# Patient Record
Sex: Female | Born: 1970 | Race: White | Hispanic: No | Marital: Single | Smoking: Never smoker
Health system: Southern US, Community
[De-identification: ages and names within clinical notes are randomized; demographics above are authoritative.]

## PROBLEM LIST (undated history)

## (undated) DIAGNOSIS — J309 Allergic rhinitis, unspecified: Secondary | ICD-10-CM

## (undated) DIAGNOSIS — Q261 Persistent left superior vena cava: Secondary | ICD-10-CM

## (undated) DIAGNOSIS — G2581 Restless legs syndrome: Secondary | ICD-10-CM

## (undated) DIAGNOSIS — I5032 Chronic diastolic (congestive) heart failure: Secondary | ICD-10-CM

## (undated) DIAGNOSIS — Q211 Atrial septal defect, unspecified: Secondary | ICD-10-CM

## (undated) DIAGNOSIS — I82409 Acute embolism and thrombosis of unspecified deep veins of unspecified lower extremity: Secondary | ICD-10-CM

## (undated) DIAGNOSIS — Q249 Congenital malformation of heart, unspecified: Secondary | ICD-10-CM

## (undated) DIAGNOSIS — K219 Gastro-esophageal reflux disease without esophagitis: Secondary | ICD-10-CM

## (undated) DIAGNOSIS — R011 Cardiac murmur, unspecified: Secondary | ICD-10-CM

## (undated) DIAGNOSIS — L709 Acne, unspecified: Secondary | ICD-10-CM

## (undated) DIAGNOSIS — H6690 Otitis media, unspecified, unspecified ear: Secondary | ICD-10-CM

## (undated) DIAGNOSIS — F4010 Social phobia, unspecified: Secondary | ICD-10-CM

## (undated) DIAGNOSIS — G4733 Obstructive sleep apnea (adult) (pediatric): Secondary | ICD-10-CM

## (undated) DIAGNOSIS — J45909 Unspecified asthma, uncomplicated: Secondary | ICD-10-CM

## (undated) DIAGNOSIS — E669 Obesity, unspecified: Secondary | ICD-10-CM

## (undated) HISTORY — DX: Allergic rhinitis, unspecified: J30.9

## (undated) HISTORY — DX: Social phobia, unspecified: F40.10

## (undated) HISTORY — DX: Persistent left superior vena cava: Q26.1

## (undated) HISTORY — DX: Otitis media, unspecified, unspecified ear: H66.90

## (undated) HISTORY — DX: Obesity, unspecified: E66.9

## (undated) HISTORY — PX: CARDIAC SURGERY: SHX584

## (undated) HISTORY — DX: Obstructive sleep apnea (adult) (pediatric): G47.33

## (undated) HISTORY — DX: Acute embolism and thrombosis of unspecified deep veins of unspecified lower extremity: I82.409

## (undated) HISTORY — DX: Congenital malformation of heart, unspecified: Q24.9

## (undated) HISTORY — DX: Acne, unspecified: L70.9

## (undated) HISTORY — DX: Chronic diastolic (congestive) heart failure: I50.32

## (undated) HISTORY — PX: TONSILLECTOMY: SUR1361

## (undated) HISTORY — DX: Restless legs syndrome: G25.81

## (undated) HISTORY — PX: TYMPANOSTOMY TUBE PLACEMENT: SHX32

---

## 1998-11-30 ENCOUNTER — Other Ambulatory Visit: Admission: RE | Admit: 1998-11-30 | Discharge: 1998-11-30 | Payer: Self-pay

## 2002-04-29 ENCOUNTER — Other Ambulatory Visit: Admission: RE | Admit: 2002-04-29 | Discharge: 2002-04-29 | Payer: Self-pay | Admitting: Family Medicine

## 2005-06-08 ENCOUNTER — Other Ambulatory Visit: Admission: RE | Admit: 2005-06-08 | Discharge: 2005-06-08 | Payer: Self-pay | Admitting: Family Medicine

## 2007-06-28 ENCOUNTER — Other Ambulatory Visit: Admission: RE | Admit: 2007-06-28 | Discharge: 2007-06-28 | Payer: Self-pay | Admitting: Family Medicine

## 2008-11-10 ENCOUNTER — Other Ambulatory Visit: Admission: RE | Admit: 2008-11-10 | Discharge: 2008-11-10 | Payer: Self-pay | Admitting: Family Medicine

## 2012-01-23 ENCOUNTER — Other Ambulatory Visit (HOSPITAL_COMMUNITY)
Admission: RE | Admit: 2012-01-23 | Discharge: 2012-01-23 | Disposition: A | Discharge status: Home / Self Care | Payer: BC Managed Care – PPO | Source: Ambulatory Visit | Attending: Family Medicine | Admitting: Family Medicine

## 2012-01-23 ENCOUNTER — Other Ambulatory Visit: Payer: Self-pay | Admitting: Family Medicine

## 2012-01-23 DIAGNOSIS — Z1151 Encounter for screening for human papillomavirus (HPV): Secondary | ICD-10-CM | POA: Insufficient documentation

## 2012-01-23 DIAGNOSIS — Z124 Encounter for screening for malignant neoplasm of cervix: Secondary | ICD-10-CM | POA: Insufficient documentation

## 2012-11-24 ENCOUNTER — Inpatient Hospital Stay (HOSPITAL_COMMUNITY): Payer: BC Managed Care – PPO

## 2012-11-24 ENCOUNTER — Emergency Department (HOSPITAL_COMMUNITY): Payer: BC Managed Care – PPO

## 2012-11-24 ENCOUNTER — Inpatient Hospital Stay (HOSPITAL_COMMUNITY)
Admission: EM | Admit: 2012-11-24 | Discharge: 2012-11-26 | DRG: 127 | Disposition: A | Discharge status: Home / Self Care | Payer: BC Managed Care – PPO | Attending: Internal Medicine | Admitting: Internal Medicine

## 2012-11-24 ENCOUNTER — Encounter (HOSPITAL_COMMUNITY): Payer: Self-pay | Admitting: Nurse Practitioner

## 2012-11-24 DIAGNOSIS — J45909 Unspecified asthma, uncomplicated: Secondary | ICD-10-CM | POA: Diagnosis present

## 2012-11-24 DIAGNOSIS — I509 Heart failure, unspecified: Principal | ICD-10-CM

## 2012-11-24 DIAGNOSIS — J4521 Mild intermittent asthma with (acute) exacerbation: Secondary | ICD-10-CM

## 2012-11-24 DIAGNOSIS — R011 Cardiac murmur, unspecified: Secondary | ICD-10-CM

## 2012-11-24 DIAGNOSIS — Z8774 Personal history of (corrected) congenital malformations of heart and circulatory system: Secondary | ICD-10-CM

## 2012-11-24 DIAGNOSIS — Q211 Atrial septal defect, unspecified: Secondary | ICD-10-CM

## 2012-11-24 DIAGNOSIS — Z9889 Other specified postprocedural states: Secondary | ICD-10-CM

## 2012-11-24 DIAGNOSIS — I5031 Acute diastolic (congestive) heart failure: Secondary | ICD-10-CM | POA: Diagnosis present

## 2012-11-24 DIAGNOSIS — E876 Hypokalemia: Secondary | ICD-10-CM | POA: Diagnosis present

## 2012-11-24 DIAGNOSIS — J45901 Unspecified asthma with (acute) exacerbation: Secondary | ICD-10-CM

## 2012-11-24 DIAGNOSIS — F411 Generalized anxiety disorder: Secondary | ICD-10-CM | POA: Diagnosis present

## 2012-11-24 DIAGNOSIS — R0789 Other chest pain: Secondary | ICD-10-CM | POA: Diagnosis present

## 2012-11-24 DIAGNOSIS — Z8614 Personal history of Methicillin resistant Staphylococcus aureus infection: Secondary | ICD-10-CM

## 2012-11-24 DIAGNOSIS — T380X5A Adverse effect of glucocorticoids and synthetic analogues, initial encounter: Secondary | ICD-10-CM | POA: Diagnosis present

## 2012-11-24 DIAGNOSIS — I5030 Unspecified diastolic (congestive) heart failure: Secondary | ICD-10-CM

## 2012-11-24 HISTORY — DX: Cardiac murmur, unspecified: R01.1

## 2012-11-24 HISTORY — DX: Atrial septal defect, unspecified: Q21.10

## 2012-11-24 HISTORY — DX: Atrial septal defect: Q21.1

## 2012-11-24 HISTORY — DX: Unspecified asthma, uncomplicated: J45.909

## 2012-11-24 LAB — POCT I-STAT 3, ART BLOOD GAS (G3+)
Acid-base deficit: 1 mmol/L (ref 0.0–2.0)
Bicarbonate: 22.6 mEq/L (ref 20.0–24.0)
O2 Saturation: 96 %
Patient temperature: 98.5
TCO2: 24 mmol/L (ref 0–100)

## 2012-11-24 LAB — BASIC METABOLIC PANEL
GFR calc Af Amer: >90 mL/min (ref >90)
GFR calc non Af Amer: >90 mL/min (ref >90)
Glucose, Bld: 119 mg/dL — ABNORMAL HIGH (ref 70–99)
Potassium: 3.5 mEq/L (ref 3.5–5.1)
Sodium: 139 mEq/L (ref 135–145)

## 2012-11-24 LAB — CBC
Hemoglobin: 13.9 g/dL (ref 12.0–15.0)
MCHC: 33.7 g/dL (ref 30.0–36.0)
WBC: 19.7 10*3/uL — ABNORMAL HIGH (ref 4.0–10.5)

## 2012-11-24 LAB — MAGNESIUM: Magnesium: 1.7 mg/dL (ref 1.5–2.5)

## 2012-11-24 LAB — URINALYSIS, ROUTINE W REFLEX MICROSCOPIC
Bilirubin Urine: NEGATIVE
Hgb urine dipstick: NEGATIVE
Protein, ur: NEGATIVE mg/dL
Urobilinogen, UA: 0.2 mg/dL (ref 0.0–1.0)

## 2012-11-24 LAB — PREGNANCY, URINE: Preg Test, Ur: NEGATIVE

## 2012-11-24 LAB — POCT I-STAT TROPONIN I: Troponin i, poc: 0 ng/mL (ref 0.00–0.08)

## 2012-11-24 MED ORDER — ASPIRIN 81 MG PO CHEW
324.0000 mg | CHEWABLE_TABLET | Freq: Once | ORAL | Status: AC
  Administered 2012-11-24: 324 mg via ORAL
  Filled 2012-11-24: qty 4

## 2012-11-24 MED ORDER — MORPHINE SULFATE 2 MG/ML IJ SOLN
2.0000 mg | INTRAMUSCULAR | Status: DC | PRN
Start: 2012-11-24 — End: 2012-11-26
  Administered 2012-11-24: 2 mg via INTRAVENOUS
  Filled 2012-11-24: qty 1

## 2012-11-24 MED ORDER — SODIUM CHLORIDE 0.9 % IJ SOLN: 3.0000 mL | INTRAMUSCULAR | Status: DC | PRN

## 2012-11-24 MED ORDER — FLUTICASONE PROPIONATE HFA 44 MCG/ACT IN AERO
2.0000 | INHALATION_SPRAY | Freq: Two times a day (BID) | RESPIRATORY_TRACT | Status: DC
  Administered 2012-11-24 – 2012-11-26 (×4): 2 via RESPIRATORY_TRACT
  Filled 2012-11-24: qty 10.6

## 2012-11-24 MED ORDER — MUPIROCIN 2 % EX OINT
1.0000 "application " | TOPICAL_OINTMENT | Freq: Two times a day (BID) | CUTANEOUS | Status: DC
  Administered 2012-11-24 – 2012-11-26 (×4): 1 via NASAL
  Filled 2012-11-24: qty 22

## 2012-11-24 MED ORDER — IPRATROPIUM BROMIDE 0.02 % IN SOLN
0.5000 mg | Freq: Once | RESPIRATORY_TRACT | Status: AC
  Administered 2012-11-24: 0.5 mg via RESPIRATORY_TRACT
  Filled 2012-11-24: qty 2.5

## 2012-11-24 MED ORDER — HEPARIN SODIUM (PORCINE) 5000 UNIT/ML IJ SOLN
5000.0000 [IU] | Freq: Three times a day (TID) | INTRAMUSCULAR | Status: DC
  Administered 2012-11-24 – 2012-11-26 (×6): 5000 [IU] via SUBCUTANEOUS
  Filled 2012-11-24 (×8): qty 1

## 2012-11-24 MED ORDER — ONDANSETRON HCL 4 MG/2ML IJ SOLN: 4.0000 mg | Freq: Four times a day (QID) | INTRAMUSCULAR | Status: DC | PRN

## 2012-11-24 MED ORDER — SODIUM CHLORIDE 0.9 % IV SOLN: 250.0000 mL | INTRAVENOUS | Status: DC | PRN

## 2012-11-24 MED ORDER — SODIUM CHLORIDE 0.9 % IJ SOLN
3.0000 mL | Freq: Two times a day (BID) | INTRAMUSCULAR | Status: DC
  Administered 2012-11-24 – 2012-11-26 (×3): 3 mL via INTRAVENOUS

## 2012-11-24 MED ORDER — FUROSEMIDE 10 MG/ML IJ SOLN
40.0000 mg | Freq: Two times a day (BID) | INTRAMUSCULAR | Status: DC
  Administered 2012-11-24 – 2012-11-25 (×2): 40 mg via INTRAVENOUS
  Filled 2012-11-24 (×4): qty 4

## 2012-11-24 MED ORDER — MORPHINE SULFATE 2 MG/ML IJ SOLN
2.0000 mg | Freq: Once | INTRAMUSCULAR | Status: AC
  Administered 2012-11-24: 2 mg via INTRAVENOUS
  Filled 2012-11-24: qty 1

## 2012-11-24 MED ORDER — ALBUTEROL SULFATE (5 MG/ML) 0.5% IN NEBU
5.0000 mg | INHALATION_SOLUTION | Freq: Once | RESPIRATORY_TRACT | Status: AC
  Administered 2012-11-24: 5 mg via RESPIRATORY_TRACT
  Filled 2012-11-24: qty 1

## 2012-11-24 MED ORDER — ALBUTEROL SULFATE (5 MG/ML) 0.5% IN NEBU
5.0000 mg | INHALATION_SOLUTION | Freq: Four times a day (QID) | RESPIRATORY_TRACT | Status: DC
  Administered 2012-11-24: 5 mg via RESPIRATORY_TRACT
  Filled 2012-11-24 (×3): qty 0.5

## 2012-11-24 MED ORDER — ROPINIROLE HCL 1 MG PO TABS
1.0000 mg | ORAL_TABLET | Freq: Every day | ORAL | Status: DC
  Administered 2012-11-24 – 2012-11-25 (×2): 1 mg via ORAL
  Filled 2012-11-24 (×3): qty 1

## 2012-11-24 MED ORDER — IPRATROPIUM BROMIDE 0.02 % IN SOLN
0.5000 mg | Freq: Four times a day (QID) | RESPIRATORY_TRACT | Status: DC
  Administered 2012-11-24 – 2012-11-25 (×5): 0.5 mg via RESPIRATORY_TRACT
  Filled 2012-11-24 (×5): qty 2.5

## 2012-11-24 MED ORDER — CHLORHEXIDINE GLUCONATE CLOTH 2 % EX PADS
6.0000 | MEDICATED_PAD | Freq: Every day | CUTANEOUS | Status: DC
  Administered 2012-11-25 – 2012-11-26 (×2): 6 via TOPICAL

## 2012-11-24 MED ORDER — ACETAMINOPHEN 325 MG PO TABS
650.0000 mg | ORAL_TABLET | ORAL | Status: DC | PRN
  Administered 2012-11-24 – 2012-11-25 (×2): 650 mg via ORAL
  Filled 2012-11-24 (×2): qty 2

## 2012-11-24 MED ORDER — MONTELUKAST SODIUM 10 MG PO TABS
10.0000 mg | ORAL_TABLET | Freq: Every day | ORAL | Status: DC
  Administered 2012-11-24 – 2012-11-25 (×2): 10 mg via ORAL
  Filled 2012-11-24 (×3): qty 1

## 2012-11-24 MED ORDER — FUROSEMIDE 10 MG/ML IJ SOLN
20.0000 mg | Freq: Once | INTRAMUSCULAR | Status: AC
  Administered 2012-11-24: 20 mg via INTRAVENOUS
  Filled 2012-11-24 (×2): qty 2

## 2012-11-24 MED ORDER — SERTRALINE HCL 100 MG PO TABS
200.0000 mg | ORAL_TABLET | Freq: Every day | ORAL | Status: DC
  Administered 2012-11-24 – 2012-11-26 (×3): 200 mg via ORAL
  Filled 2012-11-24 (×3): qty 2

## 2012-11-24 MED ORDER — FENTANYL CITRATE 0.05 MG/ML IJ SOLN
50.0000 ug | Freq: Once | INTRAMUSCULAR | Status: AC
  Administered 2012-11-24: 50 ug via INTRAVENOUS
  Filled 2012-11-24: qty 2

## 2012-11-24 NOTE — Progress Notes (Signed)
Notified Md.  Breathing treatments given. Pt. Stated " it did help some but I'm still a little short of breath and still have chest tightness.  But its better than what is was ".   Vs stable.  No new orders at this time.  Will continue to monitor. Charlotte Chapman

## 2012-11-24 NOTE — ED Notes (Signed)
X-ray called to confirm DG chest 2 view after Portable Chest. Dr. Dierdre Searles sts wants 2 view- difficult to visualize in portable.

## 2012-11-24 NOTE — Progress Notes (Signed)
  Echocardiogram 2D Echocardiogram has been performed.  Charlotte Chapman FRANCES 11/24/2012, 5:13 PM

## 2012-11-24 NOTE — Consult Note (Signed)
Cardiology Consult Note No primary provider on file. No ref. provider found  Reason for consult: shortness of breath  History of Present Illness (and review of medical records): Charlotte Chapman is a 42 y.o. female who presents for evaluation of shortness of breath.  She is followed by Dr. Mayford Knife with [facility].  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  No other reported surgeries per patient.  She states around 9am developed acute onset of shortness of breath.  This was associated with chest tightness.  Her symptoms were not like an asthma exacerbation.  She denied any wheezing.  She denied any PND, orthopnea or LE swelling.  As her symptoms did not improve, she called EMS.  She was given ASA and morphine for chest pain.  She was admitted to the [facility] service for further evaluation and management.  We were consulted for assistance.  She has no chest pain at this time.  She does have some shortness of breath although improved.  Review of Systems She reports sinus infection a few weeks prior. No current fevers, chills, night sweats.  Further review of systems was otherwise negative other than stated in HPI.  Patient Active Problem List   Diagnosis Date Noted  . Acute CHF 11/24/2012  . Asthma   . Atrial septal defect   . Heart murmur    Past Medical History  Diagnosis Date  . Asthma   . Atrial septal defect   . Heart murmur     Past Surgical History  Procedure Laterality Date  . Cardiac surgery    . Tonsillectomy      Prescriptions prior to admission  Medication Sig Dispense Refill  . budesonide (PULMICORT) 180 MCG/ACT inhaler Inhale 1 puff into the lungs 2 (two) times daily.      Marland Kitchen desogestrel-ethinyl estradiol (ENSKYCE) 0.15-30 MG-MCG tablet Take 1 tablet by mouth daily.      . montelukast (SINGULAIR) 10 MG tablet Take 10 mg by mouth at bedtime.      Marland Kitchen rOPINIRole (REQUIP) 1 MG tablet Take 1 mg by mouth at bedtime.      . sertraline (ZOLOFT) 100 MG tablet Take 200 mg  by mouth daily.       Allergies  Allergen Reactions  . Duraflex     History  Substance Use Topics  . Smoking status: Never Smoker   . Smokeless tobacco: Not on file  . Alcohol Use: Yes    History reviewed. No pertinent family history.   Objective:  Patient Vitals for the past 8 hrs:  BP Temp Temp src Pulse Resp SpO2 Height Weight  11/24/12 2200 120/58 mmHg - - 78 25 98 % - -  11/24/12 2100 109/62 mmHg - - 76 37 98 % - -  11/24/12 2031 147/98 mmHg 98.9 F (37.2 C) Axillary 86 22 100 % - -  11/24/12 2026 - - - - - 100 % - -  11/24/12 2000 153/83 mmHg - - 86 20 97 % - -  11/24/12 1930 141/102 mmHg - - 89 22 100 % - -  11/24/12 1915 147/107 mmHg - - 82 20 100 % - -  11/24/12 1900 143/105 mmHg - - 90 23 100 % - -  11/24/12 1845 139/68 mmHg 98.4 F (36.9 C) Oral 83 24 100 % 5\' 2"  (1.575 m) 79.379 kg (175 lb)  11/24/12 1700 114/65 mmHg - - 95 23 99 % - -  11/24/12 1530 143/95 mmHg - - 84 18  100 % - -   General appearance: alert, cooperative, appears stated age and no distress Head: Normocephalic, without obvious abnormality, atraumatic Eyes: conjunctivae/corneas clear. PERRL, EOM's intact. Fundi benign. Neck: no carotid bruit, no JVD and supple, symmetrical, trachea midline Lungs: clear to auscultation bilaterally Heart: regular rate and rhythm, S1, S2 normal, Abdomen: soft, non-tender; bowel sounds normal; no masses,  no organomegaly Extremities: extremities normal, atraumatic, no cyanosis or edema Pulses: 2+ and symmetric Neurologic: Grossly normal  Results for orders placed during the hospital encounter of 11/24/12 (from the past 48 hour(s))  CBC     Status: Abnormal   Collection Time    11/24/12 11:21 AM      Result Value Range   WBC 19.7 (*) 4.0 - 10.5 K/uL   RBC 4.72  3.87 - 5.11 MIL/uL   Hemoglobin 13.9  12.0 - 15.0 g/dL   HCT 16.1  09.6 - 04.5 %   MCV 87.3  78.0 - 100.0 fL   MCH 29.4  26.0 - 34.0 pg   MCHC 33.7  30.0 - 36.0 g/dL   RDW 40.9  81.1 - 91.4 %    Platelets 309  150 - 400 K/uL  BASIC METABOLIC PANEL     Status: Abnormal   Collection Time    11/24/12 11:21 AM      Result Value Range   Sodium 139  135 - 145 mEq/L   Potassium 3.5  3.5 - 5.1 mEq/L   Chloride 105  96 - 112 mEq/L   CO2 23  19 - 32 mEq/L   Glucose, Bld 119 (*) 70 - 99 mg/dL   BUN 13  6 - 23 mg/dL   Creatinine, Ser 7.82  0.50 - 1.10 mg/dL   Calcium 9.5  8.4 - 95.6 mg/dL   GFR calc non Af Amer >90  >90 mL/min   GFR calc Af Amer >90  >90 mL/min   Comment:            The eGFR has been calculated     using the CKD EPI equation.     This calculation has not been     validated in all clinical     situations.     eGFR's persistently     <90 mL/min signify     possible Chronic Kidney Disease.  PRO B NATRIURETIC PEPTIDE     Status: Abnormal   Collection Time    11/24/12 11:21 AM      Result Value Range   Pro B Natriuretic peptide (BNP) 386.3 (*) 0 - 125 pg/mL  POCT I-STAT TROPONIN I     Status: None   Collection Time    11/24/12 11:59 AM      Result Value Range   Troponin i, poc 0.00  0.00 - 0.08 ng/mL   Comment 3            Comment: Due to the release kinetics of cTnI,     a negative result within the first hours     of the onset of symptoms does not rule out     myocardial infarction with certainty.     If myocardial infarction is still suspected,     repeat the test at appropriate intervals.  PREGNANCY, URINE     Status: None   Collection Time    11/24/12  1:57 PM      Result Value Range   Preg Test, Ur NEGATIVE  NEGATIVE   Comment:  THE SENSITIVITY OF THIS     METHODOLOGY IS >20 mIU/mL.  URINALYSIS, ROUTINE W REFLEX MICROSCOPIC     Status: None   Collection Time    11/24/12  1:57 PM      Result Value Range   Color, Urine YELLOW  YELLOW   APPearance CLEAR  CLEAR   Specific Gravity, Urine 1.028  1.005 - 1.030   pH 5.5  5.0 - 8.0   Glucose, UA NEGATIVE  NEGATIVE mg/dL   Hgb urine dipstick NEGATIVE  NEGATIVE   Bilirubin Urine NEGATIVE   NEGATIVE   Ketones, ur NEGATIVE  NEGATIVE mg/dL   Protein, ur NEGATIVE  NEGATIVE mg/dL   Urobilinogen, UA 0.2  0.0 - 1.0 mg/dL   Nitrite NEGATIVE  NEGATIVE   Leukocytes, UA NEGATIVE  NEGATIVE   Comment: MICROSCOPIC NOT DONE ON URINES WITH NEGATIVE PROTEIN, BLOOD, LEUKOCYTES, NITRITE, OR GLUCOSE <1000 mg/dL.  POCT I-STAT 3, BLOOD GAS (G3+)     Status: Abnormal   Collection Time    11/24/12  4:40 PM      Result Value Range   pH, Arterial 7.432  7.350 - 7.450   pCO2 arterial 33.8 (*) 35.0 - 45.0 mmHg   pO2, Arterial 81.0  80.0 - 100.0 mmHg   Bicarbonate 22.6  20.0 - 24.0 mEq/L   TCO2 24  0 - 100 mmol/L   O2 Saturation 96.0     Acid-base deficit 1.0  0.0 - 2.0 mmol/L   Patient temperature 98.5 F     Collection site RADIAL, ALLEN'S TEST ACCEPTABLE     Sample type ARTERIAL    TROPONIN I     Status: None   Collection Time    11/24/12  6:02 PM      Result Value Range   Troponin I <0.30  <0.30 ng/mL   Comment:            Due to the release kinetics of cTnI,     a negative result within the first hours     of the onset of symptoms does not rule out     myocardial infarction with certainty.     If myocardial infarction is still suspected,     repeat the test at appropriate intervals.  MAGNESIUM     Status: None   Collection Time    11/24/12  6:02 PM      Result Value Range   Magnesium 1.7  1.5 - 2.5 mg/dL  MRSA PCR SCREENING     Status: Abnormal   Collection Time    11/24/12  6:40 PM      Result Value Range   MRSA by PCR POSITIVE (*) NEGATIVE   Comment:            The GeneXpert MRSA Assay (FDA     approved for NASAL specimens     only), is one component of a     comprehensive MRSA colonization     surveillance program. It is not     intended to diagnose MRSA     infection nor to guide or     monitor treatment for     MRSA infections.     RESULT CALLED TO, READ BACK BY AND VERIFIED WITH:     [facility] RN 2035 11/24/12 MITCHELL,L   Dg Chest 2 View  11/24/2012   *RADIOLOGY  REPORT*  Clinical Data: Chest pain and shortness of breath.  CHEST - 2 VIEW  Comparison: 11/24/2012  Findings: Mild cardiomegaly.  Central basilar  edema.  Bibasilar atelectasis.  No pneumothorax.  No pleural effusion.  IMPRESSION: Mild CHF.  Bibasilar atelectasis.   Original Report Authenticated By: Jolaine Click, M.D.   Dg Chest Portable 1 View  11/24/2012   *RADIOLOGY REPORT*  Clinical Data: Short of breath  PORTABLE CHEST - 1 VIEW  Comparison: None  Findings: Cardiac enlargement with congestive heart failure.  There is bilateral edema and small effusions.  Right rib deformities are noted which may be congenital or due to old injury.  IMPRESSION: Congestive heart failure with pulmonary edema and small pleural effusions.   Original Report Authenticated By: Janeece Riggers, M.D.    ECG:  Sinus rhythm LVH, no prior to compare   Impression: Suspect CHF exacerbation Atypical chest pain Hx ASD closure  Recommendations: -Agree with gentle diuresis with IV lasix BID -Strict I/Os, dialy weights, 2L fluid restriction, 2gm Na diet, heart failure education -Replace elctrolyetes, keep K ~4.0, Mg ~2.0 -Monitor renal function -TTE will be read in am. -Cycle biomarkers r/o MI  Thank you for this consult.  We will follow along with you and make further recommendations as needed.

## 2012-11-24 NOTE — Progress Notes (Signed)
Md notified of pts MRSA + .  VS stable.  Educated and placed pt. On contact. Will continue to monitor. Emilie [place] Liter

## 2012-11-24 NOTE — ED Notes (Signed)
Pt reports chest pain and sob onset while watching tv this am. Describes the pain as tight and sharp pain with inspiration. 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

## 2012-11-24 NOTE — ED Provider Notes (Addendum)
CSN: [id number]     Arrival date & time 11/24/12  1107 History     First MD Initiated Contact with Patient 11/24/12 1131     Chief Complaint  Patient presents with  . Chest Pain   (Consider location/radiation/quality/duration/timing/severity/associated sxs/prior Treatment) HPI Comments: 42 year old white female presents emergency department with chest pain onset 2 hours ago. Patient denies recent travel, history of blood clots, recent surgery, history of similar pain in the past. She does have a history of asthma and she is currently taking birth control  Patient is a 42 y.o. female presenting with chest pain. The history is provided by the patient.  Chest Pain Pain location:  L chest Pain quality: aching   Pain radiates to:  Does not radiate Pain radiates to the back: no   Pain severity:  Moderate Onset quality:  Gradual Duration:  2 hours Timing:  Constant Progression:  Unchanged Chronicity:  New Context: at rest   Relieved by:  None tried Worsened by:  Deep breathing and movement Associated symptoms: no abdominal pain, no AICD problem, no altered mental status, no anorexia, no back pain, no cough, no fever, no orthopnea, no palpitations, no shortness of breath, no syncope, not vomiting and no weakness   Risk factors: birth control   Risk factors: no coronary artery disease and no diabetes mellitus     Past Medical History  Diagnosis Date  . Asthma   . Atrial septal defect   . Heart murmur    Past Surgical History  Procedure Laterality Date  . Cardiac surgery    . Tonsillectomy     History reviewed. No pertinent family history. History  Substance Use Topics  . Smoking status: Never Smoker   . Smokeless tobacco: Not on file  . Alcohol Use: Yes   OB History   Grav Para Term Preterm Abortions TAB SAB Ect Mult Living                 Review of Systems  Constitutional: Negative.  Negative for fever.  HENT: Negative.   Eyes: Negative.  Negative for discharge.   Respiratory: Negative for apnea, cough, choking, chest tightness, shortness of breath, wheezing and stridor.   Cardiovascular: Positive for chest pain. Negative for palpitations, orthopnea and syncope.  Gastrointestinal: Negative for vomiting, abdominal pain, blood in stool, abdominal distention, anal bleeding and anorexia.  Endocrine: Negative.   Genitourinary: Negative.   Musculoskeletal: Negative for back pain.  Skin: Negative.   Allergic/Immunologic: Negative.   Neurological: Negative.  Negative for weakness.  Psychiatric/Behavioral: Negative for altered mental status.    Allergies  Duraflex  Home Medications  No current outpatient prescriptions on file. BP 147/66  Pulse 64  Temp(Src) 98.5 F (36.9 C) (Oral)  Resp 20  SpO2 100% Physical Exam  Constitutional: She is oriented to person, place, and time. She appears well-developed and well-nourished.  HENT:  Head: Normocephalic and atraumatic.  Eyes: Conjunctivae are normal. Pupils are equal, round, and reactive to light.  Neck: Normal range of motion. Neck supple. No JVD present. No tracheal deviation present. No thyromegaly present.  Cardiovascular: Normal rate, regular rhythm and normal heart sounds.   No clicks rubs or murmurs appreciated There is palpation on left anterior chest wall reproduces chest pain symptoms   Pulmonary/Chest: Effort normal and breath sounds normal. No stridor.  Musculoskeletal: Normal range of motion. She exhibits no edema and no tenderness.  No clubbing cyanosis or edema  Lymphadenopathy:    She has no cervical adenopathy.  Neurological: She is alert and oriented to person, place, and time. She exhibits normal muscle tone. Coordination normal.  Skin: Skin is warm and dry.  Psychiatric: She has a normal mood and affect.    ED Course   Procedures (including critical care time)  Labs Reviewed  CBC  BASIC METABOLIC PANEL  PRO B NATRIURETIC PEPTIDE   No results found. No diagnosis  found.  EKG#1  Date: 11/24/2012  Rate: 72  Rhythm: normal sinus rhythm  QRS Axis: normal  Intervals: normal  ST/T Wave abnormalities: normal  Conduction Disutrbances:none  Narrative Interpretation:   Old EKG Reviewed: none available  EKG#2  Date: 11/24/2012  Rate: 66  Rhythm: normal sinus rhythm  QRS Axis: normal  Intervals: normal  ST/T Wave abnormalities: normal  Conduction Disutrbances:none  Narrative Interpretation: No signs of ischemic changes  Old EKG Reviewed: unchanged     Results for orders placed during the hospital encounter of 11/24/12  CBC      Result Value Range   WBC 19.7 (*) 4.0 - 10.5 K/uL   RBC 4.72  3.87 - 5.11 MIL/uL   Hemoglobin 13.9  12.0 - 15.0 g/dL   HCT 57.8  46.9 - 62.9 %   MCV 87.3  78.0 - 100.0 fL   MCH 29.4  26.0 - 34.0 pg   MCHC 33.7  30.0 - 36.0 g/dL   RDW 52.8  41.3 - 24.4 %   Platelets 309  150 - 400 K/uL  BASIC METABOLIC PANEL      Result Value Range   Sodium 139  135 - 145 mEq/L   Potassium 3.5  3.5 - 5.1 mEq/L   Chloride 105  96 - 112 mEq/L   CO2 23  19 - 32 mEq/L   Glucose, Bld 119 (*) 70 - 99 mg/dL   BUN 13  6 - 23 mg/dL   Creatinine, Ser 0.10  0.50 - 1.10 mg/dL   Calcium 9.5  8.4 - 27.2 mg/dL   GFR calc non Af Amer >90  >90 mL/min   GFR calc Af Amer >90  >90 mL/min  PRO B NATRIURETIC PEPTIDE      Result Value Range   Pro B Natriuretic peptide (BNP) 386.3 (*) 0 - 125 pg/mL  POCT I-STAT TROPONIN I      Result Value Range   Troponin i, poc 0.00  0.00 - 0.08 ng/mL   Comment 3            CXR: IMPRESSION: Congestive heart failure with pulmonary edema and small pleural effusions.    MDM  42 year old white female presents with chest pain acute onset 2 hours ago.  Pain began at rest while patient was watching TV. Vital signs on arrival normal.  EKG on arrival without signs of ischemic changes. Plan to obtain blood work to include troponin, chest x-ray, and given aspirin and morphine and continued observation or to  Darlys Gales, MD 11/24/12 1148  Darlys Gales, MD 11/24/12 1346

## 2012-11-24 NOTE — ED Notes (Signed)
Pt c/o increased CP and SOB since morphine given. Verlene Mayer RN placed pt on 2L  and made [place] RN made aware.

## 2012-11-24 NOTE — H&P (Signed)
Date: 11/24/2012               Patient Name:  Charlotte Chapman MRN: [id number]  DOB: 09/07/1970 Age / Sex: 42 y.o., female   PCP: No primary provider on file.         Medical Service: Internal Medicine Teaching Service         Attending Physician: Dr. Jonah Blue, DO    First Contact: Dr. Angelina Sheriff, MD Pager: [phone]  Second Contact: Dr. Dede Query, MD Pager: [phone]       After Hours (After 5p/  First Contact Pager: [phone]  weekends / holidays): Second Contact Pager: [phone]   Chief Complaint: Chest Pain  History of Present Illness: Charlotte Chapman Is a 42 y.o. female with a pmhx of asthma, ASD s/p repair at age 25, and anxiety who comes to the ED with an acute onset of chest pain when she woke up this morning. The patient endorses a multiple week history of upper respiratory infection symptoms. She was seen by her PCP who treated her initially with steroid and then with steroid and amoxicillin. He URI symptoms have been completely resolved for the last 1 week. This morning, the patient woke up around 9 am and had 8/10 chest pain. The pain has been continuous throughout the day. The pain is made worse with deep inspiration. Over the last week the patient denies chest pain but admits to increasing chest tightness. The patient says that this tightness is different than her baseline asthma.    In the ED, troponin was negative and there was no ST elevation. ProBNP was 305. EKG demonstrated probable LVH with secondary repol abdnormality. CXR was significant for pulmonary edema. The patients chest pain decreased after IV pain medication ans breathing treament. The IMTS was consulted to admit the patient for likely acute heart failure.  Meds: Current Facility-Administered Medications  Medication Dose Route Frequency Provider Last Rate Last Dose  . 0.9 %  sodium chloride infusion  250 mL Intravenous PRN Na Dierdre Searles, MD      . acetaminophen (TYLENOL) tablet 650 mg  650 mg Oral Q4H PRN Na Dierdre Searles, MD        . fluticasone (FLOVENT HFA) 44 MCG/ACT inhaler 2 puff  2 puff Inhalation BID Na Dierdre Searles, MD      . furosemide (LASIX) injection 40 mg  40 mg Intravenous Q12H Na Li, MD      . heparin injection 5,000 Units  5,000 Units Subcutaneous Q8H Na Li, MD      . montelukast (SINGULAIR) tablet 10 mg  10 mg Oral QHS Na Li, MD      . morphine 2 MG/ML injection 2 mg  2 mg Intravenous Q4H PRN Na Dierdre Searles, MD      . ondansetron (ZOFRAN) injection 4 mg  4 mg Intravenous Q6H PRN Na Li, MD      . rOPINIRole (REQUIP) tablet 1 mg  1 mg Oral QHS Na Li, MD      . sertraline (ZOLOFT) tablet 200 mg  200 mg Oral Daily Na Li, MD      . sodium chloride 0.9 % injection 3 mL  3 mL Intravenous Q12H Na Li, MD      . sodium chloride 0.9 % injection 3 mL  3 mL Intravenous PRN Dede Query, MD       Current Outpatient Prescriptions  Medication Sig Dispense Refill  . budesonide (PULMICORT) 180 MCG/ACT inhaler Inhale 1 puff into the lungs 2 (two) times daily.      Marland Kitchen  desogestrel-ethinyl estradiol (ENSKYCE) 0.15-30 MG-MCG tablet Take 1 tablet by mouth daily.      . montelukast (SINGULAIR) 10 MG tablet Take 10 mg by mouth at bedtime.      Marland Kitchen rOPINIRole (REQUIP) 1 MG tablet Take 1 mg by mouth at bedtime.      . sertraline (ZOLOFT) 100 MG tablet Take 200 mg by mouth daily.        Allergies: Allergies as of 11/24/2012 - Review Complete 11/24/2012  Allergen Reaction Noted  . Duraflex  11/24/2012   Past Medical History  Diagnosis Date  . Asthma   . Atrial septal defect   . Heart murmur    Past Surgical History  Procedure Laterality Date  . Cardiac surgery    . Tonsillectomy     History reviewed. No pertinent family history. History   Social History  . Marital Status: Single    Spouse Name: N/A    Number of Children: N/A  . Years of Education: N/A   Occupational History  . Not on file.   Social History Main Topics  . Smoking status: Never Smoker   . Smokeless tobacco: Not on file  . Alcohol Use: Yes  . Drug Use: No  . Sexually  Active: Not on file   Other Topics Concern  . Not on file   Social History Narrative  . No narrative on file    Review of Systems: Review of Systems  Constitutional: Positive for diaphoresis. Negative for fever, chills and malaise/fatigue.  HENT: Negative for sore throat.   Eyes: Negative for blurred vision and double vision.  Respiratory: Positive for shortness of breath. Negative for cough and wheezing.   Cardiovascular: Positive for chest pain. Negative for palpitations and leg swelling.  Gastrointestinal: Negative for nausea, vomiting, abdominal pain, diarrhea and constipation.  Genitourinary: Negative for dysuria, urgency and frequency.  Skin: Negative for itching and rash.  Neurological: Negative for weakness and headaches.     Physical Exam: Blood pressure 114/65, pulse 95, temperature 98.5 F (36.9 C), temperature source Oral, resp. rate 23, last menstrual period 11/10/2012, SpO2 99.00%. Physical Exam  Constitutional: She is oriented to person, place, and time. She appears well-developed and well-nourished. No distress.  HENT:  Head: Normocephalic and atraumatic.  Mouth/Throat: Oropharynx is clear and moist. No oropharyngeal exudate.  Eyes: EOM are normal. Pupils are equal, round, and reactive to light.  Neck: Normal range of motion. Neck supple. JVD present.  Cardiovascular: Normal rate and regular rhythm.   Systolic III/6 crescendo decrescendo heard loudest in the LUSB.  Musculoskeletal:  Tender to palpation on the left sternum. This IS NOT THE PAIN THAT THE PATIENT IS COMPLAINING OF.  Neurological: She is alert and oriented to person, place, and time.  Skin: She is not diaphoretic.  Psychiatric: She has a normal mood and affect. Her behavior is normal.  anxious    Lab results: Basic Metabolic Panel:  Recent Labs  14/78/29 1121  NA 139  K 3.5  CL 105  CO2 23  GLUCOSE 119*  BUN 13  CREATININE 0.65  CALCIUM 9.5   CBC:  Recent Labs  11/24/12 1121    WBC 19.7*  HGB 13.9  HCT 41.2  MCV 87.3  PLT 309   Cardiac Enzymes: No results found for this basename: CKTOTAL, CKMB, CKMBINDEX, TROPONINI,  in the last 72 hours BNP:  Recent Labs  11/24/12 1121  PROBNP 386.3*   D-Dimer: No results found for this basename: DDIMER,  in the last 72  hours CBG: No results found for this basename: GLUCAP,  in the last 72 hours Hemoglobin A1C: No results found for this basename: HGBA1C,  in the last 72 hours Fasting Lipid Panel: No results found for this basename: CHOL, HDL, LDLCALC, TRIG, CHOLHDL, LDLDIRECT,  in the last 72 hours Thyroid Function Tests: No results found for this basename: TSH, T4TOTAL, FREET4, T3FREE, THYROIDAB,  in the last 72 hours Anemia Panel: No results found for this basename: VITAMINB12, FOLATE, FERRITIN, TIBC, IRON, RETICCTPCT,  in the last 72 hours Coagulation: No results found for this basename: LABPROT, INR,  in the last 72 hours Urine Drug Screen: Drugs of Abuse  No results found for this basename: labopia,  cocainscrnur,  labbenz,  amphetmu,  thcu,  labbarb    Alcohol Level: No results found for this basename: ETH,  in the last 72 hours Urinalysis:  Recent Labs  11/24/12 1357  COLORURINE YELLOW  LABSPEC 1.028  PHURINE 5.5  GLUCOSEU NEGATIVE  HGBUR NEGATIVE  BILIRUBINUR NEGATIVE  KETONESUR NEGATIVE  PROTEINUR NEGATIVE  UROBILINOGEN 0.2  NITRITE NEGATIVE  LEUKOCYTESUR NEGATIVE   Imaging results:  Dg Chest 2 View  11/24/2012   *RADIOLOGY REPORT*  Clinical Data: Chest pain and shortness of breath.  CHEST - 2 VIEW  Comparison: 11/24/2012  Findings: Mild cardiomegaly.  Central basilar edema.  Bibasilar atelectasis.  No pneumothorax.  No pleural effusion.  IMPRESSION: Mild CHF.  Bibasilar atelectasis.   Original Report Authenticated By: Jolaine Click, M.D.   Dg Chest Portable 1 View  11/24/2012   *RADIOLOGY REPORT*  Clinical Data: Short of breath  PORTABLE CHEST - 1 VIEW  Comparison: None  Findings: Cardiac  enlargement with congestive heart failure.  There is bilateral edema and small effusions.  Right rib deformities are noted which may be congenital or due to old injury.  IMPRESSION: Congestive heart failure with pulmonary edema and small pleural effusions.   Original Report Authenticated By: Janeece Riggers, M.D.    Other results: EKG: sinus rhythm with borderline LVH and repol abdnormality  Assessment & Plan by Problem: Principal Problem:   Acute CHF Active Problems:   Asthma   Atrial septal defect  Assessment Plan  Acute CHF The patients acute onset chest pain and pulmonary edema is of an unknown etiology at this time (TIMI =1). The differential includes viral viral induced cardiomyopathy, myocarditis, NSTEMI, pericardial effusion without tamponade, valvular pathology, and PE. PE is unlikely as Geneva score is 0. Her history is consistent with a viral induced cardiomyopathy. Tropin 0.0 x1 - Consult [facility] for stat ECHO, whoa greed to see the patient - Lasix for diuresis (40 mg q 12 hr) - Morphine IV for chest pain - ABG - 2 view CXR - Step down unit as patient continues to have 5/10 chest pain. - F/U TSH, Mag Trop   Asthma The patients asthma may be contributing to her chest tightness. However, she denies wheezing and states that her current sensation does not feel like her normal asthma attacks. - Ipratropium and Albuterol - Montelukast - O2 titrated   Chest Wall Pain The patient has pain with palpation of her sternum. She states that this pain IS NOT the chest discomfort that brought her to the hospital. - Tylenol prn - ASA  Atrial Septal Defect - F/U Echo  Anxiety The patient states that she is anxious, but does not feel that her current chest pain is due to anxiety. - Continue home sertraline  DVT/Nutrition Regular Diet    Dispo:  Disposition is deferred at this time, awaiting improvement of current medical problems. Anticipated discharge in approximately 2-3 day(s).     The patient does have a current PCP (No primary provider on file.) and does not need an [facility] hospital follow-up appointment after discharge.  The patient does not have transportation limitations that hinder transportation to clinic appointments.  Signed: Pleas Koch, MD 11/24/2012, 6:02 PM

## 2012-11-24 NOTE — ED Notes (Signed)
Vascular technician at bedside 

## 2012-11-24 NOTE — ED Notes (Signed)
Terry with portable X-ray at bedside.

## 2012-11-25 DIAGNOSIS — I5031 Acute diastolic (congestive) heart failure: Secondary | ICD-10-CM | POA: Diagnosis present

## 2012-11-25 LAB — BASIC METABOLIC PANEL
BUN: 12 mg/dL (ref 6–23)
Calcium: 9.5 mg/dL (ref 8.4–10.5)
Creatinine, Ser: 0.71 mg/dL (ref 0.50–1.10)
GFR calc Af Amer: >90 mL/min (ref >90)
GFR calc non Af Amer: >90 mL/min (ref >90)
Glucose, Bld: 120 mg/dL — ABNORMAL HIGH (ref 70–99)
Potassium: 3.2 mEq/L — ABNORMAL LOW (ref 3.5–5.1)

## 2012-11-25 LAB — TROPONIN I: Troponin I: <0.3 ng/mL (ref <0.30)

## 2012-11-25 MED ORDER — ALBUTEROL SULFATE (5 MG/ML) 0.5% IN NEBU
2.5000 mg | INHALATION_SOLUTION | RESPIRATORY_TRACT | Status: DC | PRN
  Administered 2012-11-25: 2.5 mg via RESPIRATORY_TRACT

## 2012-11-25 MED ORDER — LEVALBUTEROL HCL 1.25 MG/0.5ML IN NEBU
1.2500 mg | INHALATION_SOLUTION | Freq: Once | RESPIRATORY_TRACT | Status: AC
  Administered 2012-11-25: 1.25 mg via RESPIRATORY_TRACT
  Filled 2012-11-25: qty 0.5

## 2012-11-25 MED ORDER — ALBUTEROL SULFATE (5 MG/ML) 0.5% IN NEBU
2.5000 mg | INHALATION_SOLUTION | Freq: Four times a day (QID) | RESPIRATORY_TRACT | Status: DC
Start: 2012-11-25 — End: 2012-11-25
  Administered 2012-11-25: 2.5 mg via RESPIRATORY_TRACT
  Filled 2012-11-25: qty 0.5

## 2012-11-25 MED ORDER — POTASSIUM CHLORIDE CRYS ER 20 MEQ PO TBCR
40.0000 meq | EXTENDED_RELEASE_TABLET | Freq: Once | ORAL | Status: AC
  Administered 2012-11-25: 40 meq via ORAL
  Filled 2012-11-25: qty 2

## 2012-11-25 MED ORDER — LEVALBUTEROL HCL 1.25 MG/0.5ML IN NEBU
1.2500 mg | INHALATION_SOLUTION | Freq: Four times a day (QID) | RESPIRATORY_TRACT | Status: DC
  Administered 2012-11-25: 1.25 mg via RESPIRATORY_TRACT
  Filled 2012-11-25 (×3): qty 0.5

## 2012-11-25 MED ORDER — FUROSEMIDE 10 MG/ML IJ SOLN
40.0000 mg | Freq: Once | INTRAMUSCULAR | Status: AC
  Administered 2012-11-26: 40 mg via INTRAVENOUS

## 2012-11-25 NOTE — H&P (Signed)
INTERNAL MEDICINE TEACHING SERVICE Attending Admission Note  Date: 11/25/2012  Patient name: Charlotte Chapman  Medical record number: [id number]  Date of birth: [birth year]    I have seen and evaluated [name] and discussed their care with the Residency Team.  42 yr. Old w/ hx asthma, ASD with repair at age 42, presented with SOB and CP.  She had a hx of recent viral upper respiratory illness, she was prescribed a steroid and amoxicillin.   She was found to have a new dx of HFpEF.  She is improving with diuresis. No ACS.  Cardiology consulted.  On exam, she has decreased air movement and feels it is difficult to take a deep breath. No minimize risk of tachycardia, I would use Xopenex to treat her as she may have a component of asthma causing her symptoms.     Jonah Blue, [place] 8/11/20141:49 PM

## 2012-11-25 NOTE — Care Management Note (Signed)
    Page 1 of 1   11/25/2012     9:00:27 AM   CARE MANAGEMENT NOTE 11/25/2012  Patient:  Charlotte Chapman, Charlotte Chapman   Account Number:  [id number]  Date Initiated:  11/25/2012  Documentation initiated by:  Junius Creamer  Subjective/Objective Assessment:   adm w heart failure     Action/Plan:   lives w fam   Anticipated DC Date:     Anticipated DC Plan:        DC Planning Services  CM consult      Choice offered to / List presented to:             Status of service:   Medicare Important Message given?   (If response is "NO", the following Medicare IM given date fields will be blank) Date Medicare IM given:   Date Additional Medicare IM given:    Discharge Disposition:    Per UR Regulation:  Reviewed for med. necessity/level of care/duration of stay  If discussed at Long Length of Stay Meetings, dates discussed:    Comments:  8/11 69 debbie Marcheta Horsey rn,bsn pt in w heart fialure. will moniter for hhc/dc needs as pt progresses.

## 2012-11-25 NOTE — Progress Notes (Signed)
SUBJECTIVE:  Still has some mild sternal CP to palpation  OBJECTIVE:   Vitals:   Filed Vitals:   11/25/12 0532 11/25/12 0735 11/25/12 0820 11/25/12 1225  BP: 124/49 119/66  121/73  Pulse: 79 74  78  Temp:   99.2 F (37.3 C) 99.1 F (37.3 C)  TempSrc:   Oral Oral  Resp: 27 27  26   Height:      Weight:      SpO2: 92% 99%  99%   I&O's:   Intake/Output Summary (Last 24 hours) at 11/25/12 1510 Last data filed at 11/25/12 1300  Gross per 24 hour  Intake    460 ml  Output   2400 ml  Net  -1940 ml   TELEMETRY: Reviewed telemetry pt in NSR:     PHYSICAL EXAM General: Well developed, well nourished, in no acute distress Head: Eyes PERRLA, No xanthomas.   Normal cephalic and atramatic  Lungs:   Crackles at bases Heart:   HRRR S1 S2 Pulses are 2+ & equal. Abdomen: Bowel sounds are positive, abdomen soft and non-tender without masses  Extremities:   No clubbing, cyanosis or edema.  DP +1 Neuro: Alert and oriented X 3. Psych:  Good affect, responds appropriately   LABS: Basic Metabolic Panel:  Recent Labs  16/10/96 1121 11/24/12 1802 11/25/12 0510  NA 139  --  142  K 3.5  --  3.2*  CL 105  --  105  CO2 23  --  26  GLUCOSE 119*  --  120*  BUN 13  --  12  CREATININE 0.65  --  0.71  CALCIUM 9.5  --  9.5  MG  --  1.7  --    Liver Function Tests: No results found for this basename: AST, ALT, ALKPHOS, BILITOT, PROT, ALBUMIN,  in the last 72 hours No results found for this basename: LIPASE, AMYLASE,  in the last 72 hours CBC:  Recent Labs  11/24/12 1121  WBC 19.7*  HGB 13.9  HCT 41.2  MCV 87.3  PLT 309   Cardiac Enzymes:  Recent Labs  11/24/12 1802 11/25/12 11/25/12 0510  TROPONINI <0.30 <0.30 <0.30   Thyroid Function Tests:  Recent Labs  11/24/12 1802  TSH 1.471   Anemia Panel: No results found for this basename: VITAMINB12, FOLATE, FERRITIN, TIBC, IRON, RETICCTPCT,  in the last 72 hours Coag Panel:   No results found for this basename: INR,  PROTIME    RADIOLOGY: Dg Chest 2 View  11/24/2012   *RADIOLOGY REPORT*  Clinical Data: Chest pain and shortness of breath.  CHEST - 2 VIEW  Comparison: 11/24/2012  Findings: Mild cardiomegaly.  Central basilar edema.  Bibasilar atelectasis.  No pneumothorax.  No pleural effusion.  IMPRESSION: Mild CHF.  Bibasilar atelectasis.   Original Report Authenticated By: Jolaine Click, M.D.   Dg Chest Portable 1 View  11/24/2012   *RADIOLOGY REPORT*  Clinical Data: Short of breath  PORTABLE CHEST - 1 VIEW  Comparison: None  Findings: Cardiac enlargement with congestive heart failure.  There is bilateral edema and small effusions.  Right rib deformities are noted which may be congenital or due to old injury.  IMPRESSION: Congestive heart failure with pulmonary edema and small pleural effusions.   Original Report Authenticated By: Janeece Riggers, M.D.      ASSESSMENT:  1.  Acute diastolic CHF most likely secondary to volume overload from prednisone.  2D echo with normal LVF.  She is 2.2L out although no weight loss noted.  2.  Atypical CP - pleuritic in nature and most likely related to underlying URI.  She also has reproducible chest wall pain - she told the admitting MD that this was not the pain she came in with but now tells me the chest wall pain is the same pain she was admitted with.  Cardiac enzymes are negative and EKG with no ischemia. 3.  ASD s/p repair at age 66 - no evidence of residual shunt on echo 4.  Asthma 5.  Recent URI 6.  Hypokalemia 6.  Elevated WBC most likely related to steroids  PLAN:   1.  Replete potassium 2.  Continue IV diuretics  Quintella Reichert, MD  11/25/2012  3:10 PM

## 2012-11-25 NOTE — Progress Notes (Signed)
Subjective: Patient is seen at bedside and she states that she feels better. Her chest tightness still persists but improved. Her chest pain has decreased.; Objective: Vital signs in last 24 hours: Filed Vitals:   11/25/12 0532 11/25/12 0735 11/25/12 0820 11/25/12 1225  BP: 124/49 119/66  121/73  Pulse: 79 74  78  Temp:   99.2 F (37.3 C) 99.1 F (37.3 C)  TempSrc:   Oral Oral  Resp: 27 27  26   Height:      Weight:      SpO2: 92% 99%  99%   Weight change:  gained 4 pounds from yesterday  Intake/Output Summary (Last 24 hours) at 11/25/12 1310 Last data filed at 11/25/12 1000  Gross per 24 hour  Intake    140 ml  Output   2660 ml  Net  -2520 ml   General appearance: alert and cooperative Neck: JVD - 5 cm above sternal notch, no adenopathy and no carotid bruit Lungs: bilateral basilar crackles, no wheezes, ro rales Heart: regular rate and rhythm, S1, S2 normal, systolic murmur: systolic ejection 3/6, crescendo and decrescendo at 2nd left intercostal space and no rub Abdomen: soft, non-tender; bowel sounds normal; no masses,  no organomegaly Extremities: extremities normal, atraumatic, no cyanosis or edema Pulses: 2+ and symmetric   Labs:  Basic Metabolic Panel:  Recent Labs Lab 11/24/12 1121 11/24/12 1802 11/25/12 0510  NA 139  --  142  K 3.5  --  3.2*  CL 105  --  105  CO2 23  --  26  GLUCOSE 119*  --  120*  BUN 13  --  12  CREATININE 0.65  --  0.71  CALCIUM 9.5  --  9.5  MG  --  1.7  --    CBC:  Recent Labs Lab 11/24/12 1121  WBC 19.7*  HGB 13.9  HCT 41.2  MCV 87.3  PLT 309    Cardiac Enzymes:  Recent Labs Lab 11/24/12 1802 11/25/12 11/25/12 0510  TROPONINI <0.30 <0.30 <0.30    BNP: Pro BNP - 386.3  Other results: EKG: Sinus rhythm LVH, no prior to compare   Micro Results: Recent Results (from the past 240 hour(s))  MRSA PCR SCREENING     Status: Abnormal   Collection Time    11/24/12  6:40 PM      Result Value Range Status   MRSA by PCR POSITIVE (*) NEGATIVE Final   Comment:            The GeneXpert MRSA Assay (FDA     approved for NASAL specimens     only), is one component of a     comprehensive MRSA colonization     surveillance program. It is not     intended to diagnose MRSA     infection nor to guide or     monitor treatment for     MRSA infections.     RESULT CALLED TO, READ BACK BY AND VERIFIED WITH:     [facility] RN [id number] 11/24/12 MITCHELL,L   Studies/Results: Dg Chest 2 View  11/24/2012   *RADIOLOGY REPORT*  Clinical Data: Chest pain and shortness of breath.  CHEST - 2 VIEW  Comparison: 11/24/2012  Findings: Mild cardiomegaly.  Central basilar edema.  Bibasilar atelectasis.  No pneumothorax.  No pleural effusion.  IMPRESSION: Mild CHF.  Bibasilar atelectasis.   Original Report Authenticated By: Jolaine Click, M.D.   Dg Chest Portable 1 View  11/24/2012   *RADIOLOGY REPORT*  Clinical  Data: Short of breath  PORTABLE CHEST - 1 VIEW  Comparison: None  Findings: Cardiac enlargement with congestive heart failure.  There is bilateral edema and small effusions.  Right rib deformities are noted which may be congenital or due to old injury.  IMPRESSION: Congestive heart failure with pulmonary edema and small pleural effusions.   Original Report Authenticated By: Janeece Riggers, M.D.    ECHO (11/24/12)  - Left ventricle: The cavity size was normal. Systolic function was normal. The estimated ejection fraction was in the range of 55% to 60%. Although no diagnostic regional wall motion abnormality was identified, this possibility cannot be completely excluded on the basis of this study. Features are consistent with a pseudonormal left ventricular filling pattern, with concomitant abnormal relaxation and increased filling pressure (grade 2 diastolic dysfunction). - Left atrium: The atrium was mildly dilated. - Atrial septum: No defect or patent foramen ovale was identified (prior repair).  Medications: I have  reviewed the patient's current medications. Scheduled Meds: . Chlorhexidine Gluconate Cloth  6 each Topical Q0600  . fluticasone  2 puff Inhalation BID  . furosemide  40 mg Intravenous Q12H  . heparin  5,000 Units Subcutaneous Q8H  . ipratropium  0.5 mg Nebulization Q6H  . levalbuterol  1.25 mg Nebulization Once  . montelukast  10 mg Oral QHS  . mupirocin ointment  1 application Nasal BID  . rOPINIRole  1 mg Oral QHS  . sertraline  200 mg Oral Daily  . sodium chloride  3 mL Intravenous Q12H   Continuous Infusions:  PRN Meds:.sodium chloride, acetaminophen, albuterol, morphine injection, ondansetron (ZOFRAN) IV, sodium chloride Assessment/Plan: Assessment  Plan   Acute CHF  The patients acute onset chest pain and pulmonary edema is of an unknown etiology at this time (TIMI =1). The differential includes steroid induced fluid overload leading to CHF, viral induced cardiomyopathy, myocarditis, NSTEMI, pericardial effusion without tamponade, valvular pathology, and PE. PE is unlikely as Geneva score is 0. Her history is consistent with a steroid induced fluid overload leading to CHF and viral induced cardiomyopathy  - Lasix for diuresis (40 mg once daily)  - Morphine IV for chest pain  - strict Input / output monitoring - F/U BMP - F/U previous ECHO - Cardiology F/U  Asthma  The patients asthma may be contributing to her chest tightness. However, she denies wheezing and states that her current sensation does not feel like her normal asthma attacks.  - Ipratropium and Albuterol  - Montelukast  - O2 titrated   Chest Wall Pain  The patient has pain with palpation of her sternum. She states that this pain IS NOT the chest discomfort that brought her to the hospital.  - Tylenol prn   Atrial Septal Defect  - ECHO suggested No defect or patent foramen ovale was identified (prior repair).   Anxiety  The patient states that she is anxious, but does not feel that her current chest pain is due to  anxiety.  - Continue home sertraline   DVT/Nutrition  -Heparin/Regular Diet     This is a Psychologist, occupational Note.  The care of the patient was discussed with Dr. Kem Kays and the assessment and plan formulated with their assistance.  Please see their attached note for official documentation of the daily encounter.   LOS: 1 day   Nijas Nazar  11/25/2012, 1:10 PM

## 2012-11-26 DIAGNOSIS — Q211 Atrial septal defect: Secondary | ICD-10-CM

## 2012-11-26 LAB — CBC WITH DIFFERENTIAL/PLATELET
Basophils Absolute: 0 10*3/uL (ref 0.0–0.1)
Eosinophils Relative: 3 % (ref 0–5)
HCT: 42.9 % (ref 36.0–46.0)
Lymphocytes Relative: 24 % (ref 12–46)
Lymphs Abs: 2.2 10*3/uL (ref 0.7–4.0)
MCV: 88.3 fL (ref 78.0–100.0)
Monocytes Absolute: 0.7 10*3/uL (ref 0.1–1.0)
Monocytes Relative: 7 % (ref 3–12)
RDW: 14 % (ref 11.5–15.5)
WBC: 9.3 10*3/uL (ref 4.0–10.5)

## 2012-11-26 LAB — BASIC METABOLIC PANEL
CO2: 23 mEq/L (ref 19–32)
Chloride: 104 mEq/L (ref 96–112)
Creatinine, Ser: 0.71 mg/dL (ref 0.50–1.10)

## 2012-11-26 MED ORDER — FUROSEMIDE 20 MG PO TABS: 20.0000 mg | ORAL_TABLET | Freq: Every day | ORAL | Status: DC

## 2012-11-26 MED ORDER — LEVALBUTEROL HCL 1.25 MG/0.5ML IN NEBU
1.2500 mg | INHALATION_SOLUTION | Freq: Four times a day (QID) | RESPIRATORY_TRACT | Status: DC | PRN
  Filled 2012-11-26: qty 0.5

## 2012-11-26 MED ORDER — ACETAMINOPHEN 325 MG PO TABS: 650.0000 mg | ORAL_TABLET | ORAL | Status: DC | PRN

## 2012-11-26 MED ORDER — IPRATROPIUM BROMIDE 0.02 % IN SOLN
0.5000 mg | Freq: Three times a day (TID) | RESPIRATORY_TRACT | Status: DC
Start: 2012-11-26 — End: 2012-11-26
  Administered 2012-11-26 (×2): 0.5 mg via RESPIRATORY_TRACT
  Filled 2012-11-26 (×3): qty 2.5

## 2012-11-26 MED ORDER — LEVALBUTEROL HCL 1.25 MG/0.5ML IN NEBU
1.2500 mg | INHALATION_SOLUTION | Freq: Three times a day (TID) | RESPIRATORY_TRACT | Status: DC
  Administered 2012-11-26 (×2): 1.25 mg via RESPIRATORY_TRACT
  Filled 2012-11-26 (×4): qty 0.5

## 2012-11-26 NOTE — Progress Notes (Signed)
Subjective: Patient is seen at bedside and she states that she feels better. Her chest tightness still is much improved. Her chest pain is gone.  Objective: Vital signs in last 24 hours: Filed Vitals:   11/26/12 0035 11/26/12 0400 11/26/12 0436 11/26/12 0809  BP: 118/62 124/37    Pulse:      Temp: 98.6 F (37 C) 98.4 F (36.9 C)  98.2 F (36.8 C)  TempSrc: Oral Oral  Oral  Resp:      Height:      Weight:   174 lb 4.8 oz (79.062 kg)   SpO2:  96%     Weight change: -11.2 oz (-0.318 kg) gained 4 pounds from yesterday  Intake/Output Summary (Last 24 hours) at 11/26/12 0810 Last data filed at 11/26/12 0700  Gross per 24 hour  Intake    520 ml  Output    850 ml  Net   -330 ml   Alert and oriented. Lungs clear to ausculation bilaterally. No resp distress Heart: RRR, 3/6 systlic murmur in the LUSB. No JVD No LE edema  Labs:  Basic Metabolic Panel:  Recent Labs Lab 11/24/12 1121 11/24/12 1802 11/25/12 0510 11/26/12 0428  NA 139  --  142 140  K 3.5  --  3.2* 3.7  CL 105  --  105 104  CO2 23  --  26 23  GLUCOSE 119*  --  120* 109*  BUN 13  --  12 15  CREATININE 0.65  --  0.71 0.71  CALCIUM 9.5  --  9.5 9.3  MG  --  1.7  --   --    CBC:  Recent Labs Lab 11/24/12 1121  WBC 19.7*  HGB 13.9  HCT 41.2  MCV 87.3  PLT 309    Cardiac Enzymes:  Recent Labs Lab 11/24/12 1802 11/25/12 11/25/12 0510  TROPONINI <0.30 <0.30 <0.30    BNP: Pro BNP - 386.3  Other results: EKG: Sinus rhythm LVH, no prior to compare   Micro Results: Recent Results (from the past 240 hour(s))  MRSA PCR SCREENING     Status: Abnormal   Collection Time    11/24/12  6:40 PM      Result Value Range Status   MRSA by PCR POSITIVE (*) NEGATIVE Final   Comment:            The GeneXpert MRSA Assay (FDA     approved for NASAL specimens     only), is one component of a     comprehensive MRSA colonization     surveillance program. It is not     intended to diagnose MRSA   infection nor to guide or     monitor treatment for     MRSA infections.     RESULT CALLED TO, READ BACK BY AND VERIFIED WITH:     [facility] RN 2035 11/24/12 MITCHELL,L   Studies/Results: Dg Chest 2 View  11/24/2012   *RADIOLOGY REPORT*  Clinical Data: Chest pain and shortness of breath.  CHEST - 2 VIEW  Comparison: 11/24/2012  Findings: Mild cardiomegaly.  Central basilar edema.  Bibasilar atelectasis.  No pneumothorax.  No pleural effusion.  IMPRESSION: Mild CHF.  Bibasilar atelectasis.   Original Report Authenticated By: Jolaine Click, M.D.   Dg Chest Portable 1 View  11/24/2012   *RADIOLOGY REPORT*  Clinical Data: Short of breath  PORTABLE CHEST - 1 VIEW  Comparison: None  Findings: Cardiac enlargement with congestive heart failure.  There is bilateral edema  and small effusions.  Right rib deformities are noted which may be congenital or due to old injury.  IMPRESSION: Congestive heart failure with pulmonary edema and small pleural effusions.   Original Report Authenticated By: Janeece Riggers, M.D.    ECHO (11/24/12)  - Left ventricle: The cavity size was normal. Systolic function was normal. The estimated ejection fraction was in the range of 55% to 60%. Although no diagnostic regional wall motion abnormality was identified, this possibility cannot be completely excluded on the basis of this study. Features are consistent with a pseudonormal left ventricular filling pattern, with concomitant abnormal relaxation and increased filling pressure (grade 2 diastolic dysfunction). - Left atrium: The atrium was mildly dilated. - Atrial septum: No defect or patent foramen ovale was identified (prior repair).  Medications: I have reviewed the patient's current medications. Scheduled Meds: . Chlorhexidine Gluconate Cloth  6 each Topical Q0600  . fluticasone  2 puff Inhalation BID  . heparin  5,000 Units Subcutaneous Q8H  . ipratropium  0.5 mg Nebulization TID  . levalbuterol  1.25 mg  Nebulization TID  . montelukast  10 mg Oral QHS  . mupirocin ointment  1 application Nasal BID  . rOPINIRole  1 mg Oral QHS  . sertraline  200 mg Oral Daily  . sodium chloride  3 mL Intravenous Q12H   Continuous Infusions:  PRN Meds:.sodium chloride, acetaminophen, levalbuterol, morphine injection, ondansetron (ZOFRAN) IV, sodium chloride Assessment/Plan: Assessment  Plan   Acute CHF  The patients acute onset chest pain and pulmonary edema is of an unknown etiology at this time (TIMI =1). The differential includes steroid induced fluid overload leading to CHF, viral induced cardiomyopathy, myocarditis, NSTEMI, pericardial effusion without tamponade, valvular pathology, and PE. PE is unlikely as Geneva score is 0. 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 - Plan to d/c today  Asthma  The patients asthma may be contributing to her chest tightness. However, she denies wheezing and states that her current sensation does not feel like her normal asthma attacks.  - Ipratropium and Xopenex - Montelukast  - O2 titrated   Chest Wall Pain  The patient has pain with palpation of her sternum. She states that this pain IS NOT the chest discomfort that brought her to the hospital.  - Tylenol prn   Atrial Septal Defect  - ECHO suggested No defect or patent foramen ovale was identified (prior repair).   Anxiety  The patient states that she is anxious, but does not feel that her current chest pain is due to anxiety.  - Continue home sertraline   DVT/Nutrition  -Heparin/Regular Diet        LOS: 2 days   Angelina Sheriff, MD PGY1 11/26/2012

## 2012-11-26 NOTE — Discharge Summary (Signed)
Name: Charlotte Chapman MRN: [id number] DOB: 1970/06/02 42 y.o. PCP: Sigmund Hazel  Date of Admission: 11/24/2012 11:21 AM Date of Discharge: 11/26/2012 Attending Physician: Jonah Blue, DO  Discharge Diagnosis: 1.  Acute diastolic CHF (congestive heart failure) 2.  Asthma 3. Atrial septal defect 4. Anxiety  Discharge Medications:   Medication List         acetaminophen 325 MG tablet  Commonly known as:  TYLENOL  Take 2 tablets (650 mg total) by mouth every 4 (four) hours as needed.     budesonide 180 MCG/ACT inhaler  Commonly known as:  PULMICORT  Inhale 1 puff into the lungs 2 (two) times daily.     ENSKYCE 0.15-30 MG-MCG tablet  Generic drug:  desogestrel-ethinyl estradiol  Take 1 tablet by mouth daily.     furosemide 20 MG tablet  Commonly known as:  LASIX  Take 1 tablet (20 mg total) by mouth daily.     montelukast 10 MG tablet  Commonly known as:  SINGULAIR  Take 10 mg by mouth at bedtime.     rOPINIRole 1 MG tablet  Commonly known as:  REQUIP  Take 1 mg by mouth at bedtime.     sertraline 100 MG tablet  Commonly known as:  ZOLOFT  Take 200 mg by mouth daily.        Disposition and follow-up:   Ms.Elisabella Jarboe was discharged from [facility] in Stable condition.  At the hospital follow up visit please address:  1.  Follow up her potassium levels since she was hypokalemic at the time of admission  2.  Labs / imaging needed at time of follow-up: Potassium  3.  Pending labs/ test needing follow-up: Blood culture  Follow-up Appointments:     Follow-up Information   Follow up with Quintella Reichert, MD On 12/05/2012. (at 9:45am)    Contact information:   [address] [place] [address] [phone]        Follow up with Sigmund Hazel, MD On 8/202014. (at 2.30pm)  Contact information: [address] [place] [address] [phone]    Discharge Instructions: Discharge Orders   Future Orders Complete By  Expires     (HEART FAILURE PATIENTS) Call MD:  Anytime you have any of the following symptoms: 1) 3 pound weight gain in 24 hours or 5 pounds in 1 week 2) shortness of breath, with or without a dry hacking cough 3) swelling in the hands, feet or stomach 4) if you have to sleep on extra pillows at night in order to breathe.  As directed     Diet - low sodium heart healthy  As directed     Discharge instructions  As directed     Comments:      You were admitted to the hospital with an acute episode of heart failure. The heart failure occurred because you had too much fluids, which was likely a results of recent prednisone use.  We recommend that you take Lasix 20 mg each morning until you are able to follow up with you cardiologist.    Increase activity slowly  As directed        Consultations: Treatment Team:  Quintella Reichert, MD  Procedures Performed:  Dg Chest 2 View  11/24/2012   *RADIOLOGY REPORT*  Clinical Data: Chest pain and shortness of breath.  CHEST - 2 VIEW  Comparison: 11/24/2012  Findings: Mild cardiomegaly.  Central basilar edema.  Bibasilar atelectasis.  No pneumothorax.  No pleural effusion.  IMPRESSION: Mild CHF.  Bibasilar atelectasis.   Original Report Authenticated By: Jolaine Click, M.D.   Dg Chest Portable 1 View  11/24/2012   *RADIOLOGY REPORT*  Clinical Data: Short of breath  PORTABLE CHEST - 1 VIEW  Comparison: None  Findings: Cardiac enlargement with congestive heart failure.  There is bilateral edema and small effusions.  Right rib deformities are noted which may be congenital or due to old injury.  IMPRESSION: Congestive heart failure with pulmonary edema and small pleural effusions.   Original Report Authenticated By: Janeece Riggers, M.D.    2D Echo:  - Left ventricle: The cavity size was normal. Systolic function was normal. The estimated ejection fraction was in the range of 55% to 60%. Although no diagnostic regional wall motion abnormality was identified,  this possibility cannot be completely excluded on the basis of this study. Features are consistent with a pseudonormal left ventricular filling pattern, with concomitant abnormal relaxation and increased filling pressure (grade 2 diastolic dysfunction). - Left atrium: The atrium was mildly dilated. - Atrial septum: No defect or patent foramen ovale was identified (prior repair).  EKG: Sinus rhythm LVH, no prior to compare   Admission HPI:   Charlotte Chapman Is a 42 y.o. female with a pmhx of asthma, ASD s/p repair at age 84, and anxiety who came to the ED with an acute onset of chest pain when she woke up this morning. The patient endorses a multiple week history of upper respiratory infection symptoms. She was seen by her PCP who treated her initially with steroid and then with steroid and amoxicillin. He URI symptoms have been completely resolved for the last 1 week. This morning, the patient woke up around 9 am and had 8/10 chest pain. The pain has been continuous throughout the day. The pain is made worse with deep inspiration. Over the last week the patient denies chest pain but admits to increasing chest tightness. The patient says that this tightness is different than her baseline asthma.   In the ED, troponin was negative and there was no ST elevation. ProBNP was 305. EKG demonstrated probable LVH with secondary repol abdnormality. CXR was significant for pulmonary edema. The patients chest pain decreased after IV pain medication ans breathing treament. She was admitted for likely acute heart failure.  Review of Systems:  Constitutional: Positive for diaphoresis. Negative for fever, chills and malaise/fatigue.  HENT: Negative for sore throat.  Eyes: Negative for blurred vision and double vision.  Respiratory: Positive for shortness of breath. Negative for cough and wheezing.  Cardiovascular: Positive for chest pain. Negative for palpitations and leg swelling.  Neurological: Negative for  weakness and headaches  Physical Exam:  Blood pressure 114/65, pulse 95, temperature 98.5 F (36.9 C), temperature source Oral, resp. rate 23, last menstrual period 11/10/2012, SpO2 99.00%.   Constitutional: She is oriented to person, place, and time. She appears well-developed and well-nourished. No distress.  HENT: Head: Normocephalic and atraumatic.  Mouth/Throat: Oropharynx is clear and moist. No oropharyngeal exudate.  Eyes: EOM are normal. Pupils are equal, round, and reactive to light.  Neck: Normal range of motion. Neck supple. JVD present.  Cardiovascular: Normal rate and regular rhythm; Systolic III/6 crescendo decrescendo heard loudest in the LUSB.  Musculoskeletal: Tender to palpation on the left sternum. This IS NOT THE PAIN THAT THE PATIENT IS COMPLAINING OF.  Neurological: She is alert and oriented to person, place, and time.  Skin: She is not diaphoretic.  Psychiatric: She has  a normal mood and affect. Her behavior is normal.  anxious    Hospital Course by problem list:  1. Acute diastolic CHF (congestive heart failure)  She was diagnosed as Heart failure with preserved EF most likely due to fluid overload due to the steroids she has been taking. She improved with diuresis.Her EKG was negative for ACS and LVH.Marland Kitchen Her troponin was negative, Pro- BNP was 386.3. Cardiology was consulted she was started on  Xopenex  as she was suspected to have a component of asthma causing her symptoms.and stat ECHO was done which revealed the above mentioned findings . Her previous ECHO done in 2006 showed no abnormalities. She was also put on Lasix 40mg  Q12H which was changed Lasix 40 mg  Once daily next day in v/o hypokalemia which was also replenished, Strict I/Os, dialy weights, 2L fluid restriction, 2gm Na diet, heart failure education. She was given Morphine for chest pain and ABG ( normal ) , CXR  (Mild CHF. Bibasilar atelectasis), TSH and Mg levels were normal. She was put in step down unit  in view of chest pain. The next day she sates that she felt better and her chest tightness has improved and her chest pain has decreased. Her potassium has normalised. Cardiology cleared her discharge on day 3. She was planned to send home on home treatment with Lasix 20mg  P.O daily due to distolic dysfunction with  cardiology follow up. During the course of her stay in the hospital she was on Heparin for DVT prophylaxis.  2. Asthma  The patients asthma might have been contributing to her chest tightness. However, she denies wheezing and states that her sensation at the time of admission does not feel like her normal asthma attacks. She was put on Ipratropium and albuterol inhalation, Monteleukast and O2   3. Atypical chest pain   The patient has pain with palpation of her sternum. She stated that this pain IS NOT the chest discomfort that brought her to the hospital. She was treated with Tylenol prn and ASA .  4. Atrial Septal Defect   ECHO suggested No defect or patent foramen ovale was identified (prior repair).  5. Anxiety   The patient stated that she was anxious, but does not feel that her current chest pain is due to anxiety and she was continued on her home sertraline  Discharge Vitals:   BP 104/67   Pulse 89   Temp(Src) 98.2 F (36.8 C) (Oral)   Resp 12   Ht 5\' 2"  (1.575 m)   Wt 79.062 kg (174 lb 4.8 oz)   BMI 31.87 kg/m2   SpO2 97%   LMP 11/10/2012  Discharge Labs:  Results for orders placed during the hospital encounter of 11/24/12 (from the past 24 hour(s))  BASIC METABOLIC PANEL     Status: Abnormal   Collection Time    11/26/12  4:28 AM      Result Value Range   Sodium 140  135 - 145 mEq/L   Potassium 3.7  3.5 - 5.1 mEq/L   Chloride 104  96 - 112 mEq/L   CO2 23  19 - 32 mEq/L   Glucose, Bld 109 (*) 70 - 99 mg/dL   BUN 15  6 - 23 mg/dL   Creatinine, Ser 4.78  0.50 - 1.10 mg/dL   Calcium 9.3  8.4 - 29.5 mg/dL   GFR calc non Af Amer >90  >90 mL/min   GFR calc Af Amer >90   >90 mL/min  CBC WITH DIFFERENTIAL     Status: None   Collection Time    11/26/12 10:00 AM      Result Value Range   WBC 9.3  4.0 - 10.5 K/uL   RBC 4.86  3.87 - 5.11 MIL/uL   Hemoglobin 14.2  12.0 - 15.0 g/dL   HCT 16.1  09.6 - 04.5 %   MCV 88.3  78.0 - 100.0 fL   MCH 29.2  26.0 - 34.0 pg   MCHC 33.1  30.0 - 36.0 g/dL   RDW 40.9  81.1 - 91.4 %   Platelets 300  150 - 400 K/uL   Neutrophils Relative % 66  43 - 77 %   Neutro Abs 6.1  1.7 - 7.7 K/uL   Lymphocytes Relative 24  12 - 46 %   Lymphs Abs 2.2  0.7 - 4.0 K/uL   Monocytes Relative 7  3 - 12 %   Monocytes Absolute 0.7  0.1 - 1.0 K/uL   Eosinophils Relative 3  0 - 5 %   Eosinophils Absolute 0.3  0.0 - 0.7 K/uL   Basophils Relative 0  0 - 1 %   Basophils Absolute 0.0  0.0 - 0.1 K/uL  MAGNESIUM     Status: None   Collection Time    11/26/12 10:00 AM      Result Value Range   Magnesium 2.1  1.5 - 2.5 mg/dL   This is a Psychologist, occupational Note. The care of the patient was discussed with Dr. Kem Kays and the assessment and plan formulated with their assistance. Please see their attached note for official documentation of the daily encounter.   Signed: Gretchen Short  Visiting Medical student 11/26/2012, 11:34 AM   Time Spent on Discharge: .>30 minutes Services Ordered on Discharge: None Equipment Ordered on Discharge: None

## 2012-11-26 NOTE — Progress Notes (Signed)
  Date: 11/26/2012  Patient name: Charlotte Chapman  Medical record number: [id number]  Date of birth: 07-Mar-1971   This patient has been seen and the plan of care was discussed with the house staff. Please see their note for complete details. I concur with their findings with the following additions/corrections:  Clinically better. Agree with continued PO diuresis at home with PCP follow up. She needs cards follow up as well. If ok with cardiology, she can be discharged home. She has improved significantly. HFpEF.  Jonah Blue, DO 11/26/2012, 12:32 PM

## 2012-11-26 NOTE — Progress Notes (Signed)
SUBJECTIVE:  Feels much better  OBJECTIVE:   Vitals:   Filed Vitals:   11/26/12 0035 11/26/12 0400 11/26/12 0436 11/26/12 0809  BP: 118/62 124/37    Pulse:      Temp: 98.6 F (37 C) 98.4 F (36.9 C)  98.2 F (36.8 C)  TempSrc: Oral Oral  Oral  Resp:      Height:      Weight:   79.062 kg (174 lb 4.8 oz)   SpO2:  96%     I&O's:   Intake/Output Summary (Last 24 hours) at 11/26/12 0950 Last data filed at 11/26/12 0700  Gross per 24 hour  Intake    520 ml  Output    850 ml  Net   -330 ml   TELEMETRY: Reviewed telemetry pt in NSR:     PHYSICAL EXAM General: Well developed, well nourished, in no acute distress Head: Eyes PERRLA, No xanthomas.   Normal cephalic and atramatic  Lungs:   Clear bilaterally to auscultation and percussion. Heart:   HRRR S1 S2 Pulses are 2+ & equal.            No carotid bruit. No JVD.  No abdominal bruits. No femoral bruits. Abdomen: Bowel sounds are positive, abdomen soft and non-tender without masses or                  Hernia's noted. Msk:  Back normal, normal gait. Normal strength and tone for age. Extremities:   No clubbing, cyanosis or edema.  DP +1 Neuro: Alert and oriented X 3. Psych:  Good affect, responds appropriately   LABS: Basic Metabolic Panel:  Recent Labs  96/04/54 1121 11/24/12 1802 11/25/12 0510 11/26/12 0428  NA 139  --  142 140  K 3.5  --  3.2* 3.7  CL 105  --  105 104  CO2 23  --  26 23  GLUCOSE 119*  --  120* 109*  BUN 13  --  12 15  CREATININE 0.65  --  0.71 0.71  CALCIUM 9.5  --  9.5 9.3  MG  --  1.7  --   --    Liver Function Tests: No results found for this basename: AST, ALT, ALKPHOS, BILITOT, PROT, ALBUMIN,  in the last 72 hours No results found for this basename: LIPASE, AMYLASE,  in the last 72 hours CBC:  Recent Labs  11/24/12 1121  WBC 19.7*  HGB 13.9  HCT 41.2  MCV 87.3  PLT 309   Cardiac Enzymes:  Recent Labs  11/24/12 1802 11/25/12 11/25/12 0510  TROPONINI <0.30 <0.30 <0.30    BNP: No components found with this basename: POCBNP,  D-Dimer: No results found for this basename: DDIMER,  in the last 72 hours Hemoglobin A1C: No results found for this basename: HGBA1C,  in the last 72 hours Fasting Lipid Panel: No results found for this basename: CHOL, HDL, LDLCALC, TRIG, CHOLHDL, LDLDIRECT,  in the last 72 hours Thyroid Function Tests:  Recent Labs  11/24/12 1802  TSH 1.471   Anemia Panel: No results found for this basename: VITAMINB12, FOLATE, FERRITIN, TIBC, IRON, RETICCTPCT,  in the last 72 hours Coag Panel:   No results found for this basename: INR, PROTIME    RADIOLOGY: Dg Chest 2 View  11/24/2012   *RADIOLOGY REPORT*  Clinical Data: Chest pain and shortness of breath.  CHEST - 2 VIEW  Comparison: 11/24/2012  Findings: Mild cardiomegaly.  Central basilar edema.  Bibasilar atelectasis.  No pneumothorax.  No  pleural effusion.  IMPRESSION: Mild CHF.  Bibasilar atelectasis.   Original Report Authenticated By: Jolaine Click, M.D.   Dg Chest Portable 1 View  11/24/2012   *RADIOLOGY REPORT*  Clinical Data: Short of breath  PORTABLE CHEST - 1 VIEW  Comparison: None  Findings: Cardiac enlargement with congestive heart failure.  There is bilateral edema and small effusions.  Right rib deformities are noted which may be congenital or due to old injury.  IMPRESSION: Congestive heart failure with pulmonary edema and small pleural effusions.   Original Report Authenticated By: Janeece Riggers, M.D.    ASSESSMENT:  1. Acute diastolic CHF most likely secondary to volume overload from prednisone. 2D echo with normal LVF. She is 2.7L out and She appears euvolemic on exam 2. Atypical CP - pleuritic in nature and most likely related to underlying URI. She also has reproducible chest wall pain - she told the admitting MD that this was not the pain she came in with but now tells me the chest wall pain is the same pain she was admitted with. Cardiac enzymes are negative and EKG with  no ischemia. CP is now resolved. 3. ASD s/p repair at age 83 - no evidence of residual shunt on echo  4. Asthma  5. Recent URI  6. Hypokalemia - resolved 6. Elevated WBC most likely related to steroids   PLAN:  1. Ok for d/c by cardiac standpoint 2.  Would send home on Lasix 20mg  Po daily due to diastolic dysfunction on echo 3.  Followup with me on 12/05/2012 at 9:45am     Quintella Reichert, MD  11/26/2012  9:50 AM

## 2012-11-26 NOTE — Discharge Summary (Signed)
Name: Charlotte Chapman MRN: [id number] DOB: [birth year] 42 y.o. PCP: No primary provider on file.  Date of Admission: 11/24/2012 11:21 AM Date of Discharge: 11/27/2012 Attending Physician: No att. providers found  Discharge Diagnosis: 1. Acute diastolic CHF (congestive heart failure)  2. Asthma  3. Atrial septal defect  4. Anxiety  Discharge Medications:   Medication List         acetaminophen 325 MG tablet  Commonly known as:  TYLENOL  Take 2 tablets (650 mg total) by mouth every 4 (four) hours as needed.     budesonide 180 MCG/ACT inhaler  Commonly known as:  PULMICORT  Inhale 1 puff into the lungs 2 (two) times daily.     ENSKYCE 0.15-30 MG-MCG tablet  Generic drug:  desogestrel-ethinyl estradiol  Take 1 tablet by mouth daily.     furosemide 20 MG tablet  Commonly known as:  LASIX  Take 1 tablet (20 mg total) by mouth daily.     montelukast 10 MG tablet  Commonly known as:  SINGULAIR  Take 10 mg by mouth at bedtime.     rOPINIRole 1 MG tablet  Commonly known as:  REQUIP  Take 1 mg by mouth at bedtime.     sertraline 100 MG tablet  Commonly known as:  ZOLOFT  Take 200 mg by mouth daily.        Disposition and follow-up:   Ms.Robynne Crumpler was discharged from [facility] in Good condition.  At the hospital follow up visit please address:  1.  New Onset diastolic heart failure, fluid status, lasix dose/requirement  2.  Labs / imaging needed at time of follow-up: BMP  3.  Pending labs/ test needing follow-up: None  Follow-up Appointments: Follow-up Information   Follow up with Quintella Reichert, MD On 12/05/2012. (at 9:45am)    Specialty:  Cardiology   Contact information:   [address] [place] [address] [phone]       Follow up with Neldon Labella, MD On 12/04/2012. (at 2.30pm)    Specialty:  Family Medicine   Contact information:   [address] [place] [address] [phone]       Discharge  Instructions: Discharge Orders   Future Orders Complete By Expires   (HEART FAILURE PATIENTS) Call MD:  Anytime you have any of the following symptoms: 1) 3 pound weight gain in 24 hours or 5 pounds in 1 week 2) shortness of breath, with or without a dry hacking cough 3) swelling in the hands, feet or stomach 4) if you have to sleep on extra pillows at night in order to breathe.  As directed    Diet - low sodium heart healthy  As directed    Discharge instructions  As directed    Comments:     You were admitted to the hospital with an acute episode of heart failure. The heart failure occurred because you had too much fluids, which was likely a results of recent prednisone use.  We recommend that you take Lasix 20 mg each morning until you are able to follow up with you cardiologist.   Increase activity slowly  As directed       Consultations: Treatment Team:  Quintella Reichert, MD  Procedures Performed:  Dg Chest 2 View  11/24/2012   *RADIOLOGY REPORT*  Clinical Data: Chest pain and shortness of breath.  CHEST - 2 VIEW  Comparison: 11/24/2012  Findings: Mild cardiomegaly.  Central basilar edema.  Bibasilar  atelectasis.  No pneumothorax.  No pleural effusion.  IMPRESSION: Mild CHF.  Bibasilar atelectasis.   Original Report Authenticated By: Jolaine Click, M.D.   Dg Chest Portable 1 View  11/24/2012   *RADIOLOGY REPORT*  Clinical Data: Short of breath  PORTABLE CHEST - 1 VIEW  Comparison: None  Findings: Cardiac enlargement with congestive heart failure.  There is bilateral edema and small effusions.  Right rib deformities are noted which may be congenital or due to old injury.  IMPRESSION: Congestive heart failure with pulmonary edema and small pleural effusions.   Original Report Authenticated By: Janeece Riggers, M.D.    Admission HPI:  Charlotte Chapman Is a 42 y.o. female with a pmhx of asthma, ASD s/p repair at age 76, and anxiety who came to the ED with an acute onset of chest pain when she woke up  this morning. The patient endorses a multiple week history of upper respiratory infection symptoms. She was seen by her PCP who treated her initially with steroid and then with steroid and amoxicillin. He URI symptoms have been completely resolved for the last 1 week. This morning, the patient woke up around 9 am and had 8/10 chest pain. The pain has been continuous throughout the day. The pain is made worse with deep inspiration. Over the last week the patient denies chest pain but admits to increasing chest tightness. The patient says that this tightness is different than her baseline asthma.  In the ED, troponin was negative and there was no ST elevation. ProBNP was 305. EKG demonstrated probable LVH with secondary repol abdnormality. CXR was significant for pulmonary edema. The patients chest pain decreased after IV pain medication ans breathing treament. She was admitted for likely acute heart failure.   Hospital Course by problem list: Principal Problem:   Acute diastolic CHF (congestive heart failure) Active Problems:   Acute CHF   Asthma   Atrial septal defect   1. Acute diastolic CHF (congestive heart failure)  She was diagnosed as Heart failure with preserved EF most likely due to fluid overload due to the steroids she has been taking for her sinusitis. She improved with diuresis. Her EKG was negative for ACS. Her troponin was negative, Pro- BNP was 386.3. Cardiology was consulted she was started on Xopenex as she was suspected to have a component of asthma causing her symptoms. Echocardiogaphy revealed grade 2 diastolic dysfunction with preserved EF . Her previous ECHO done in 2006 showed no abnormalities. She was also put on Lasix and diuresed well. The next day she sates that she felt better and her chest tightness has improved and her chest pain has decreased. Cardiology recommended discharge on day 3. She was sent home onh Lasix 20mg  P.O daily due to distolic dysfunction. She was  scheduled for f/u with PCP and her cardiologist.  2. Asthma  The patients asthma might have been contributing to her chest tightness. However, she denies wheezing and states that her sensation at the time of admission does not feel like her normal asthma attacks. She was put on Ipratropium and albuterol inhalation, Monteleukast and O2.  3. Atypical chest pain  The patient has pain with palpation of her sternum. She stated that this pain IS NOT the chest discomfort that brought her to the hospital. She was treated with Tylenol prn and ASA .   4. Atrial Septal Defect  ECHO suggested No defect or patent foramen ovale was identified (prior repair).   5. Anxiety  The patient stated that she  was anxious, but does not feel that her current chest pain is due to anxiety and she was continued on her home sertraline.   Discharge Vitals:   BP 104/62  Pulse 89  Temp(Src) 97.8 F (36.6 C) (Oral)  Resp 16  Ht 5\' 2"  (1.575 m)  Wt 174 lb 4.8 oz (79.062 kg)  BMI 31.87 kg/m2  SpO2 95%  LMP 11/10/2012  Discharge Labs:  No results found for this or any previous visit (from the past 24 hour(s)).  Signed: Pleas Koch, MD 11/27/2012, 1:51 PM   Time Spent on Discharge: 30 minutes Services Ordered on Discharge: None Equipment Ordered on Discharge: None

## 2012-11-28 NOTE — Discharge Summary (Signed)
  Date: 11/28/2012  Patient name: Charlotte Chapman  Medical record number: [id number]  Date of birth: 1970-06-17   This patient has been discussed with the house staff. Please see their note for complete details. I concur with their findings and plan. Jonah Blue, DO 11/28/2012, 12:56 PM

## 2012-12-01 LAB — CULTURE, BLOOD (ROUTINE X 2): Culture: NO GROWTH

## 2012-12-04 NOTE — Discharge Summary (Signed)
  I have seen and examined the patient, and reviewed the daily progress note by Gretchen Short, MS IV and discussed the care of the patient with them. Please see my progress note from 12/04/2012 for further details regarding assessment and plan.    Signed:  Pleas Koch, MD 12/04/2012, 11:36 AM

## 2013-03-06 ENCOUNTER — Ambulatory Visit: Payer: BC Managed Care – PPO | Admitting: Cardiology

## 2013-04-11 ENCOUNTER — Encounter: Payer: Self-pay | Admitting: General Surgery

## 2013-04-11 DIAGNOSIS — R079 Chest pain, unspecified: Secondary | ICD-10-CM | POA: Insufficient documentation

## 2013-04-11 DIAGNOSIS — G4733 Obstructive sleep apnea (adult) (pediatric): Secondary | ICD-10-CM | POA: Insufficient documentation

## 2013-04-23 ENCOUNTER — Ambulatory Visit (INDEPENDENT_AMBULATORY_CARE_PROVIDER_SITE_OTHER): Payer: BC Managed Care – PPO | Admitting: Cardiology

## 2013-04-23 ENCOUNTER — Encounter: Payer: Self-pay | Admitting: Cardiology

## 2013-04-23 VITALS — BP 138/50 | HR 58 | Ht 62.0 in | Wt 178.0 lb

## 2013-04-23 DIAGNOSIS — I5032 Chronic diastolic (congestive) heart failure: Secondary | ICD-10-CM | POA: Insufficient documentation

## 2013-04-23 DIAGNOSIS — Q2111 Secundum atrial septal defect: Secondary | ICD-10-CM

## 2013-04-23 DIAGNOSIS — G4733 Obstructive sleep apnea (adult) (pediatric): Secondary | ICD-10-CM

## 2013-04-23 DIAGNOSIS — Q211 Atrial septal defect, unspecified: Secondary | ICD-10-CM

## 2013-04-23 DIAGNOSIS — I509 Heart failure, unspecified: Secondary | ICD-10-CM

## 2013-04-23 NOTE — Progress Notes (Signed)
[facility], [place],   [address] Phone: [phone] Fax:  [phone]  Date:  04/23/2013   ID:  Encarnacion Chu, DOB 1971-01-29, MRN [id number]  PCP:  Tawanna Solo, MD  Cardiologist:  Fransico Him, MD  Sleep Medicine:  Fransico Him, MD   History of Present Illness: Charlotte Chapman is a 43 y.o. female with a history of ASD s/p repair, diastolic dysfunction, OSA, obesity and chronic diastolic CHF who presents today for followup.  She is doing well.  She denies any chest pain, SOB, DOE, dizziness, palpitations or syncope.  She has chronic LE edema which is stable.  She has not been using her CPAP recently.  She says that her dog chewed up the nasal pillow mask.  When she is able to use her CPAP she tolerates the mask well and feels the pressure is adequate.  She feels rested in the am and has not daytime sleepiness.  She walks with her dog for exercise.    Wt Readings from Last 3 Encounters:  04/23/13 178 lb (80.74 kg)  04/11/13 177 lb (80.287 kg)  11/26/12 174 lb 4.8 oz (79.062 kg)     Past Medical History  Diagnosis Date  . Atrial septal defect     s/p repair  . Heart murmur   . OSA (obstructive sleep apnea)   . Asthma     MILD  . Social anxiety disorder   . Acne   . Chronic ear infection   . Allergic rhinitis   . Obesity   . RLS (restless legs syndrome)     Low ferritin 6/12 10/13  . Chronic diastolic CHF (congestive heart failure)     Current Outpatient Prescriptions  Medication Sig Dispense Refill  . acetaminophen (TYLENOL) 325 MG tablet Take 2 tablets (650 mg total) by mouth every 4 (four) hours as needed.  30 tablet  0  . albuterol (PROVENTIL HFA;VENTOLIN HFA) 108 (90 BASE) MCG/ACT inhaler Inhale 1 puff into the lungs as needed for wheezing or shortness of breath.      . budesonide (PULMICORT) 180 MCG/ACT inhaler Inhale 1 puff into the lungs 2 (two) times daily.      . Calcium-Magnesium-Vitamin D (CALCIUM MAGNESIUM PO) Take 1 tablet by mouth  daily.      . cetirizine (ZYRTEC) 10 MG tablet Take 10 mg by mouth 2 (two) times daily.      Marland Kitchen desogestrel-ethinyl estradiol (ENSKYCE) 0.15-30 MG-MCG tablet Take 1 tablet by mouth daily.      . furosemide (LASIX) 20 MG tablet Take 1 tablet (20 mg total) by mouth daily.  30 tablet  0  . guaiFENesin (MUCINEX) 600 MG 12 hr tablet Take 600 mg by mouth 2 (two) times daily as needed.      Marland Kitchen L-Lysine 1000 MG TABS Take 1,000 mg by mouth as needed.      . montelukast (SINGULAIR) 10 MG tablet Take 10 mg by mouth at bedtime.      . Multiple Vitamin (MULTIVITAMIN) tablet Take 1 tablet by mouth daily.      Marland Kitchen rOPINIRole (REQUIP) 1 MG tablet Take 1 mg by mouth at bedtime.      . sertraline (ZOLOFT) 100 MG tablet Take 200 mg by mouth daily.      Marland Kitchen triamcinolone (NASACORT ALLERGY 24HR) 55 MCG/ACT AERO nasal inhaler Place 1 spray into the nose daily.       No current facility-administered medications for this visit.    Allergies:  Allergies  Allergen Reactions  . Duraflex     Social History:  The patient  reports that she has never smoked. She does not have any smokeless tobacco history on file. She reports that she does not drink alcohol or use illicit drugs.   Family History:  The patient's family history includes Asthma in her sister; Autoimmune disease in her mother; CAD in her father; Cancer in her father; Cancer - Prostate in her father; Diabetes in her mother; Heart disease in her father; Hypertension in her father.   ROS:  Please see the history of present illness.      All other systems reviewed and negative.   PHYSICAL EXAM: VS:  BP 138/50  Pulse 58  Ht 5' 2"  (1.575 m)  Wt 178 lb (80.74 kg)  BMI 32.55 kg/m2 Well nourished, well developed, in no acute distress HEENT: normal Neck: no JVD Cardiac:  normal S1, S2; RRR; no murmur Lungs:  clear to auscultation bilaterally, no wheezing, rhonchi or rales Abd: soft, nontender, no hepatomegaly Ext: no edema Skin: warm and dry Neuro:  CNs  2-12 intact, no focal abnormalities noted    ASSESSMENT AND PLAN:  1. Chronic diastolic CHF- appears compensated - continue Lasix 2. ASD s/p repair 3. OSA on CPAP - I will have her DME get her a new nasal pillow mask.  Her download today showed an AHI of 1.9/hr on 12cm H2O and 100% compliant in using more than 4 hours nightly  Followup with me in  6 months  Signed, Fransico Him, MD 04/23/2013 4:39 PM

## 2013-04-23 NOTE — Patient Instructions (Addendum)
Your physician recommends that you continue on your current medications as directed. Please refer to the Current Medication list given to you today.  We have ordered a new Redmed Airfit P10 Xtra small mask from [facility] for you. They should be contacting you within the next few days.  Your physician wants you to follow-up in: 6 Months with Dr Sherlyn Lickurner You will receive a reminder letter in the mail two months in advance. If you don't receive a letter, please call our office to schedule the follow-up appointment.

## 2013-05-01 ENCOUNTER — Encounter: Payer: Self-pay | Admitting: General Surgery

## 2013-05-01 ENCOUNTER — Encounter: Payer: Self-pay | Admitting: Cardiology

## 2013-05-22 ENCOUNTER — Encounter: Payer: Self-pay | Admitting: Cardiology

## 2013-10-11 ENCOUNTER — Encounter: Payer: Self-pay | Admitting: Cardiology

## 2013-10-20 ENCOUNTER — Ambulatory Visit (INDEPENDENT_AMBULATORY_CARE_PROVIDER_SITE_OTHER): Payer: BC Managed Care – PPO | Admitting: Cardiology

## 2013-10-20 ENCOUNTER — Encounter: Payer: Self-pay | Admitting: Cardiology

## 2013-10-20 VITALS — BP 141/84 | HR 80 | Ht 62.5 in | Wt 185.4 lb

## 2013-10-20 DIAGNOSIS — Q211 Atrial septal defect, unspecified: Secondary | ICD-10-CM

## 2013-10-20 DIAGNOSIS — G4733 Obstructive sleep apnea (adult) (pediatric): Secondary | ICD-10-CM

## 2013-10-20 DIAGNOSIS — I5032 Chronic diastolic (congestive) heart failure: Secondary | ICD-10-CM

## 2013-10-20 DIAGNOSIS — I509 Heart failure, unspecified: Secondary | ICD-10-CM

## 2013-10-20 DIAGNOSIS — Q2111 Secundum atrial septal defect: Secondary | ICD-10-CM

## 2013-10-20 NOTE — Patient Instructions (Signed)
Your physician recommends that you continue on your current medications as directed. Please refer to the Current Medication list given to you today.  Your physician recommends that you go to the lab today for a BMET  We will contact [facility] and request a download  Your physician wants you to follow-up in: 6 months with Dr Sherlyn Lickurner You will receive a reminder letter in the mail two months in advance. If you don't receive a letter, please call our office to schedule the follow-up appointment.

## 2013-10-20 NOTE — Progress Notes (Signed)
[facility] [place], [place], [place]  [address] Phone: [phone] Fax:  [phone]  Date:  10/20/2013   ID:  Charlotte Chapman, DOB 29-Jul-1970, MRN [id number]  PCP:  Tawanna Solo, MD  Cardiologist:  Fransico Him, MD     History of Present Illness: Charlotte Chapman is a 43 y.o. female with a history of ASD s/p repair, chronic diastolic CHF, OSA, obesity and chronic diastolic CHF who presents today for followup. She is doing well. She denies any chest pain, dizziness, palpitations or syncope. She has chronic LE edema which is stable. She has some mild chronic DOE which she attributes to her asthma and the hot weather.  She tolerates her PAP and tolerates the mask well and feels the pressure is adequate. She feels rested in the am and has not daytime sleepiness except on the weekend when she may take a nap. She walks with her dog for exercise.    Wt Readings from Last 3 Encounters:  10/20/13 185 lb 6.4 oz (84.097 kg)  04/23/13 178 lb (80.74 kg)  04/11/13 177 lb (80.287 kg)     Past Medical History  Diagnosis Date  . Atrial septal defect     s/p repair  . Heart murmur   . OSA (obstructive sleep apnea)   . Asthma     MILD  . Social anxiety disorder   . Acne   . Chronic ear infection   . Allergic rhinitis   . Obesity   . RLS (restless legs syndrome)     Low ferritin 6/12 10/13  . Chronic diastolic CHF (congestive heart failure)     Current Outpatient Prescriptions  Medication Sig Dispense Refill  . acetaminophen (TYLENOL) 325 MG tablet Take 2 tablets (650 mg total) by mouth every 4 (four) hours as needed.  30 tablet  0  . albuterol (PROVENTIL HFA;VENTOLIN HFA) 108 (90 BASE) MCG/ACT inhaler Inhale 1 puff into the lungs as needed for wheezing or shortness of breath.      . budesonide (PULMICORT) 180 MCG/ACT inhaler Inhale 1 puff into the lungs 2 (two) times daily.      . Calcium-Magnesium-Vitamin D (CALCIUM MAGNESIUM PO) Take 1 tablet by mouth daily.      . cetirizine  (ZYRTEC) 10 MG tablet Take 10 mg by mouth 2 (two) times daily.      Marland Kitchen desogestrel-ethinyl estradiol (ENSKYCE) 0.15-30 MG-MCG tablet Take 1 tablet by mouth daily.      . furosemide (LASIX) 20 MG tablet Take 1 tablet (20 mg total) by mouth daily.  30 tablet  0  . guaiFENesin (MUCINEX) 600 MG 12 hr tablet Take 600 mg by mouth 2 (two) times daily as needed.      Marland Kitchen L-Lysine 1000 MG TABS Take 1,000 mg by mouth as needed.      . montelukast (SINGULAIR) 10 MG tablet Take 10 mg by mouth at bedtime.      . Multiple Vitamin (MULTIVITAMIN) tablet Take 1 tablet by mouth daily.      Marland Kitchen rOPINIRole (REQUIP) 1 MG tablet Take 1 mg by mouth at bedtime.      . sertraline (ZOLOFT) 100 MG tablet Take 200 mg by mouth daily.      Marland Kitchen triamcinolone (NASACORT ALLERGY 24HR) 55 MCG/ACT AERO nasal inhaler Place 1 spray into the nose daily.       No current facility-administered medications for this visit.    Allergies:    Allergies  Allergen Reactions  . Duraflex  Social History:  The patient  reports that she has never smoked. She does not have any smokeless tobacco history on file. She reports that she does not drink alcohol or use illicit drugs.   Family History:  The patient's family history includes Asthma in her sister; Autoimmune disease in her mother; CAD in her father; Cancer in her father; Cancer - Prostate in her father; Diabetes in her mother; Heart disease in her father; Hypertension in her father.   ROS:  Please see the history of present illness.      All other systems reviewed and negative.   PHYSICAL EXAM: VS:  BP 141/84  Pulse 80  Ht 5' 2.5" (1.588 m)  Wt 185 lb 6.4 oz (84.097 kg)  BMI 33.35 kg/m2  LMP 10/06/2013 Well nourished, well developed, in no acute distress HEENT: normal Neck: no JVD Cardiac:  normal S1, S2; RRR; no murmur Lungs:  clear to auscultation bilaterally, no wheezing, rhonchi or rales Abd: soft, nontender, no hepatomegaly Ext: no edema Skin: warm and dry Neuro:  CNs  2-12 intact, no focal abnormalities noted  EKG:  NSR with no ST changes      ASSESSMENT AND PLAN:  1. Chronic diastolic CHF- appears compensated - continue Lasix.  Check BMET today 2. ASD s/p repair 3. OSA on CPAP - I will get a download from her DME  Followup with me in 6 months   Signed, Fransico Him, MD 10/20/2013 4:28 PM

## 2013-10-21 ENCOUNTER — Encounter: Payer: Self-pay | Admitting: General Surgery

## 2013-10-21 LAB — BASIC METABOLIC PANEL
BUN: 13 mg/dL (ref 6–23)
CHLORIDE: 109 meq/L (ref 96–112)
CO2: 25 mEq/L (ref 19–32)
Calcium: 9.5 mg/dL (ref 8.4–10.5)
Creatinine, Ser: 0.8 mg/dL (ref 0.4–1.2)
GFR: 83.59 mL/min (ref >60.00)
GLUCOSE: 122 mg/dL — AB (ref 70–99)
POTASSIUM: 3.5 meq/L (ref 3.5–5.1)
Sodium: 141 mEq/L (ref 135–145)

## 2013-10-22 ENCOUNTER — Other Ambulatory Visit: Payer: Self-pay | Admitting: General Surgery

## 2013-10-22 ENCOUNTER — Telehealth: Payer: Self-pay | Admitting: Cardiology

## 2013-10-22 DIAGNOSIS — Z79899 Other long term (current) drug therapy: Secondary | ICD-10-CM

## 2013-10-22 MED ORDER — POTASSIUM CHLORIDE CRYS ER 20 MEQ PO TBCR: 20.0000 meq | EXTENDED_RELEASE_TABLET | Freq: Every day | ORAL | Status: DC

## 2013-10-22 MED ORDER — FUROSEMIDE 20 MG PO TABS: 20.0000 mg | ORAL_TABLET | Freq: Every day | ORAL | Status: DC

## 2013-10-22 NOTE — Telephone Encounter (Signed)
lmtrc

## 2013-10-22 NOTE — Telephone Encounter (Signed)
Gave pt. results

## 2013-10-22 NOTE — Telephone Encounter (Signed)
rx sent in for ptl.

## 2013-10-22 NOTE — Telephone Encounter (Signed)
New message  ° ° °Returning call back to nurse.  °

## 2013-10-29 ENCOUNTER — Other Ambulatory Visit (INDEPENDENT_AMBULATORY_CARE_PROVIDER_SITE_OTHER): Payer: BC Managed Care – PPO

## 2013-10-29 DIAGNOSIS — Z79899 Other long term (current) drug therapy: Secondary | ICD-10-CM

## 2013-10-29 LAB — BASIC METABOLIC PANEL
BUN: 10 mg/dL (ref 6–23)
CO2: 27 meq/L (ref 19–32)
CREATININE: 0.8 mg/dL (ref 0.4–1.2)
Calcium: 9.2 mg/dL (ref 8.4–10.5)
Chloride: 106 mEq/L (ref 96–112)
GFR: 82.39 mL/min (ref >60.00)
GLUCOSE: 133 mg/dL — AB (ref 70–99)
Potassium: 3.8 mEq/L (ref 3.5–5.1)
SODIUM: 139 meq/L (ref 135–145)

## 2013-10-30 ENCOUNTER — Encounter: Payer: Self-pay | Admitting: General Surgery

## 2014-02-05 ENCOUNTER — Encounter: Payer: Self-pay | Admitting: Cardiology

## 2014-02-19 ENCOUNTER — Ambulatory Visit (HOSPITAL_COMMUNITY)
Admission: RE | Admit: 2014-02-19 | Discharge: 2014-02-19 | Disposition: A | Discharge status: Home / Self Care | Payer: No Typology Code available for payment source | Source: Ambulatory Visit | Attending: Family Medicine | Admitting: Family Medicine

## 2014-02-19 ENCOUNTER — Other Ambulatory Visit (HOSPITAL_COMMUNITY): Payer: Self-pay | Admitting: Family Medicine

## 2014-02-19 DIAGNOSIS — M79661 Pain in right lower leg: Secondary | ICD-10-CM

## 2014-02-19 DIAGNOSIS — I82431 Acute embolism and thrombosis of right popliteal vein: Secondary | ICD-10-CM | POA: Diagnosis not present

## 2014-02-19 DIAGNOSIS — M7989 Other specified soft tissue disorders: Principal | ICD-10-CM

## 2014-02-19 DIAGNOSIS — I82411 Acute embolism and thrombosis of right femoral vein: Secondary | ICD-10-CM | POA: Insufficient documentation

## 2014-02-19 DIAGNOSIS — I82441 Acute embolism and thrombosis of right tibial vein: Secondary | ICD-10-CM | POA: Insufficient documentation

## 2014-02-19 DIAGNOSIS — I824Z1 Acute embolism and thrombosis of unspecified deep veins of right distal lower extremity: Secondary | ICD-10-CM | POA: Diagnosis not present

## 2014-02-19 DIAGNOSIS — M79609 Pain in unspecified limb: Secondary | ICD-10-CM

## 2014-02-19 NOTE — Progress Notes (Signed)
*  Preliminary Results* Bilateral lower extremity venous duplex completed. The right lower extremity is positive for deep vein thrombosis involving the right femoral, popliteal, posterior tibial, and peroneal veins. There is no evidence of left lower extremity deep vein thrombosis of bilateral Baker's cyst.  Preliminary results discussed with Dr.Miller.  02/19/2014  Gertie FeyMichelle Sybilla Malhotra, RVT, RDCS, RDMS

## 2014-03-23 ENCOUNTER — Other Ambulatory Visit: Payer: Self-pay

## 2014-03-23 DIAGNOSIS — Z1231 Encounter for screening mammogram for malignant neoplasm of breast: Secondary | ICD-10-CM

## 2014-03-30 ENCOUNTER — Ambulatory Visit
Admission: RE | Admit: 2014-03-30 | Discharge: 2014-03-30 | Disposition: A | Discharge status: Home / Self Care | Payer: No Typology Code available for payment source | Source: Ambulatory Visit

## 2014-03-30 DIAGNOSIS — Z1231 Encounter for screening mammogram for malignant neoplasm of breast: Secondary | ICD-10-CM

## 2014-04-14 ENCOUNTER — Other Ambulatory Visit: Payer: Self-pay

## 2014-04-14 MED ORDER — FUROSEMIDE 20 MG PO TABS: 20.0000 mg | ORAL_TABLET | Freq: Every day | ORAL | Status: DC

## 2014-04-23 ENCOUNTER — Ambulatory Visit (INDEPENDENT_AMBULATORY_CARE_PROVIDER_SITE_OTHER): Payer: No Typology Code available for payment source | Admitting: Cardiology

## 2014-04-23 ENCOUNTER — Encounter: Payer: Self-pay | Admitting: Cardiology

## 2014-04-23 VITALS — BP 119/70 | HR 90 | Ht 62.0 in | Wt 196.0 lb

## 2014-04-23 DIAGNOSIS — G4733 Obstructive sleep apnea (adult) (pediatric): Secondary | ICD-10-CM

## 2014-04-23 DIAGNOSIS — I5032 Chronic diastolic (congestive) heart failure: Secondary | ICD-10-CM

## 2014-04-23 DIAGNOSIS — I82409 Acute embolism and thrombosis of unspecified deep veins of unspecified lower extremity: Secondary | ICD-10-CM | POA: Insufficient documentation

## 2014-04-23 DIAGNOSIS — Q211 Atrial septal defect, unspecified: Secondary | ICD-10-CM

## 2014-04-23 NOTE — Progress Notes (Signed)
[facility], [place], [place]  [address] Phone: [phone] Fax:  [phone]  Date:  04/23/2014   ID:  Charlotte Chapman, DOB 1970/04/30, MRN [id number]  PCP:  Tawanna Solo, MD  Cardiologist:  Fransico Him, MD    History of Present Illness: Charlotte Chapman is a 44 y.o. female with a history of ASD s/p repair, chronic diastolic CHF, OSA, obesity and chronic diastolic CHF who presents today for followup. Since I saw her last she had a LE DVT and is now on chronic anticoagulation.  It was felt to be due to inactivity and OCPs.  She is doing well. She denies any chest pain, dizziness, palpitations or syncope. She has chronic LE edema which is stable. She has some mild chronic DOE which she attributes to her asthma and the cold weather. She tolerates her PAP and tolerates the mask well and feels the pressure is adequate. She feels rested in the am for the most part and has no daytime sleepiness except on the weekend when she may take a nap. She walks with her dog for exercise.    Wt Readings from Last 3 Encounters:  04/23/14 196 lb (88.905 kg)  10/20/13 185 lb 6.4 oz (84.097 kg)  04/23/13 178 lb (80.74 kg)     Past Medical History  Diagnosis Date  . Atrial septal defect     s/p repair  . Heart murmur   . OSA (obstructive sleep apnea)   . Asthma     MILD  . Social anxiety disorder   . Acne   . Chronic ear infection   . Allergic rhinitis   . Obesity   . RLS (restless legs syndrome)     Low ferritin 6/12 10/13  . Chronic diastolic CHF (congestive heart failure)   . DVT (deep venous thrombosis)     Current Outpatient Prescriptions  Medication Sig Dispense Refill  . budesonide (PULMICORT) 180 MCG/ACT inhaler Inhale 1 puff into the lungs 2 (two) times daily.    . Calcium-Magnesium-Vitamin D (CALCIUM MAGNESIUM PO) Take 1 tablet by mouth daily.    . cetirizine (ZYRTEC) 10 MG tablet Take 10 mg by mouth 2 (two) times daily.    Marland Kitchen desogestrel-ethinyl estradiol (ENSKYCE)  0.15-30 MG-MCG tablet Take 1 tablet by mouth daily.    . furosemide (LASIX) 20 MG tablet Take 1 tablet (20 mg total) by mouth daily. 30 tablet 2  . L-Lysine 1000 MG TABS Take 1,000 mg by mouth as needed.    . montelukast (SINGULAIR) 10 MG tablet Take 10 mg by mouth at bedtime.    . Multiple Vitamin (MULTIVITAMIN) tablet Take 1 tablet by mouth daily.    . potassium chloride SA (K-DUR,KLOR-CON) 20 MEQ tablet Take 1 tablet (20 mEq total) by mouth daily. 30 tablet 5  . rOPINIRole (REQUIP) 1 MG tablet Take 1 mg by mouth at bedtime.    . sertraline (ZOLOFT) 100 MG tablet Take 200 mg by mouth daily.    Marland Kitchen triamcinolone (NASACORT ALLERGY 24HR) 55 MCG/ACT AERO nasal inhaler Place 1 spray into the nose daily.    Marland Kitchen acetaminophen (TYLENOL) 325 MG tablet Take 2 tablets (650 mg total) by mouth every 4 (four) hours as needed. (Patient not taking: Reported on 04/23/2014) 30 tablet 0  . albuterol (PROVENTIL HFA;VENTOLIN HFA) 108 (90 BASE) MCG/ACT inhaler Inhale 1 puff into the lungs as needed for wheezing or shortness of breath.    . guaiFENesin (MUCINEX) 600 MG 12 hr tablet Take 600 mg  by mouth 2 (two) times daily as needed.    . warfarin (COUMADIN) 5 MG tablet Take 5 mg by mouth daily. .7  TUES THURS SAT 10 MG MON WED FRI     No current facility-administered medications for this visit.    Allergies:    Allergies  Allergen Reactions  . Duraflex     Social History:  The patient  reports that she has never smoked. She does not have any smokeless tobacco history on file. She reports that she does not drink alcohol or use illicit drugs.   Family History:  The patient's family history includes Asthma in her sister; Autoimmune disease in her mother; CAD in her father; Cancer in her father; Cancer - Prostate in her father; Diabetes in her mother; Heart disease in her father; Hypertension in her father.   ROS:  Please see the history of present illness.      All other systems reviewed and negative.   PHYSICAL  EXAM: VS:  BP 119/70 mmHg  Pulse 90  Ht _0  (1.575 m)  Wt 196 lb (88.905 kg)  BMI 35.84 kg/m2 Well nourished, well developed, in no acute distress HEENT: normal Neck: no JVD Cardiac:  normal S1, S2; RRR; no murmur Lungs:  clear to auscultation bilaterally, no wheezing, rhonchi or rales Abd: soft, nontender, no hepatomegaly Ext: no edema Skin: warm and dry Neuro:  CNs 2-12 intact, no focal abnormalities noted  ASSESSMENT AND PLAN:  1. Chronic diastolic CHF- appears compensated - continue Lasix. Check BMET today 2. ASD s/p repair 3.   OSA on CPAP - D/L today showed an AHI of 1.9/hr on 12cm H2O and 97% compliance in using more than 4 hours nightly       4.   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

## 2014-04-23 NOTE — Patient Instructions (Signed)
Your physician recommends that you continue on your current medications as directed. Please refer to the Current Medication list given to you today.  Your physician wants you to follow-up in: 6 months with Dr Turner You will receive a reminder letter in the mail two months in advance. If you don't receive a letter, please call our office to schedule the follow-up appointment.  

## 2014-04-30 ENCOUNTER — Encounter: Payer: Self-pay | Admitting: Cardiology

## 2014-05-18 ENCOUNTER — Other Ambulatory Visit: Payer: Self-pay

## 2014-05-18 ENCOUNTER — Telehealth: Payer: Self-pay | Admitting: Oncology

## 2014-05-18 MED ORDER — POTASSIUM CHLORIDE CRYS ER 20 MEQ PO TBCR: 20.0000 meq | EXTENDED_RELEASE_TABLET | Freq: Every day | ORAL | Status: DC

## 2014-05-18 NOTE — Telephone Encounter (Signed)
Call to patient to confirm appointment for 05/19/14 at 1:30. Patient confirmed.

## 2014-05-19 ENCOUNTER — Encounter: Payer: Self-pay | Admitting: Oncology

## 2014-05-19 ENCOUNTER — Ambulatory Visit (INDEPENDENT_AMBULATORY_CARE_PROVIDER_SITE_OTHER): Payer: No Typology Code available for payment source | Admitting: Oncology

## 2014-05-19 VITALS — BP 126/62 | HR 88 | Temp 98.2°F | Ht 62.5 in | Wt 197.9 lb

## 2014-05-19 DIAGNOSIS — I82401 Acute embolism and thrombosis of unspecified deep veins of right lower extremity: Secondary | ICD-10-CM

## 2014-05-19 LAB — CBC WITH DIFFERENTIAL/PLATELET
BASOS PCT: 1 % (ref 0–1)
Basophils Absolute: 0.1 10*3/uL (ref 0.0–0.1)
Eosinophils Absolute: 0.3 10*3/uL (ref 0.0–0.7)
Eosinophils Relative: 3 % (ref 0–5)
HCT: 43.2 % (ref 36.0–46.0)
Hemoglobin: 14 g/dL (ref 12.0–15.0)
LYMPHS ABS: 2.1 10*3/uL (ref 0.7–4.0)
Lymphocytes Relative: 22 % (ref 12–46)
MCH: 28.6 pg (ref 26.0–34.0)
MCHC: 32.4 g/dL (ref 30.0–36.0)
MCV: 88.3 fL (ref 78.0–100.0)
MONO ABS: 1 10*3/uL (ref 0.1–1.0)
Monocytes Relative: 10 % (ref 3–12)
NEUTROS PCT: 64 % (ref 43–77)
Neutro Abs: 6.2 10*3/uL (ref 1.7–7.7)
Platelets: 313 10*3/uL (ref 150–400)
RBC: 4.89 MIL/uL (ref 3.87–5.11)
RDW: 14.2 % (ref 11.5–15.5)
WBC: 9.6 10*3/uL (ref 4.0–10.5)

## 2014-05-19 LAB — D-DIMER, QUANTITATIVE: D-Dimer, Quant: <0.27 ug/mL-FEU (ref 0.00–0.48)

## 2014-05-19 LAB — ANTITHROMBIN III: ANTITHROMB III FUNC: 95 % (ref 75–120)

## 2014-05-19 NOTE — Progress Notes (Signed)
Patient ID: Charlotte Chapman, female   DOB: [birth year], 44 y.o.   MRN: [id number] New Patient Hematology   Charlotte Chapman [id number] 1971-04-13 44 y.o. 05/19/2014  CC: Dr. Kathyrn Lass; Dr. Golden Hurter   Reason for referral: Advice on duration of anticoagulation   HPI:  44 year old woman in overall good health. She underwent an ASD repair at the age of 44. Over the years she has developed grade 2 diastolic dysfunction. She is overweight and has obstructive sleep apnea. She has chronic intrinsic asthma. She was on oral contraceptives for about 10 years to control abnormal periods. She presented on November fifth 2015 with acute onset of pain and swelling in her right calf. Doppler study showed acute thrombosis in the femoral, popliteal, peroneal, and posterior tibial veins. She was started on Coumadin current dose 10 mg Monday Wednesday Friday and Sunday, 7 mg on Tuesdays, Thursdays, and Saturdays. She is a never smoker. She has no signs or symptoms of a collagen vascular disorder. She has never been pregnant.  She has had no other major surgical procedures. Her mother is alive at age 83 and under treatment for autoimmune hepatitis. Father alive at age 18 has had cancer of the prostate and cancer of the small bowel. She has a sister 19, sister 68, brother 76, all healthy. No one in the family has had problems with blood clots.     PMH: Past Medical History  Diagnosis Date  . Atrial septal defect     s/p repair  . Heart murmur   . OSA (obstructive sleep apnea)   . Asthma     MILD  . Social anxiety disorder   . Acne   . Chronic ear infection   . Allergic rhinitis   . Obesity   . RLS (restless legs syndrome)     Low ferritin 6/12 10/13  . Chronic diastolic CHF (congestive heart failure)   . DVT (deep venous thrombosis)   She denies any hypertension, diabetes, ulcers, hepatitis, yellow jaundice, mononucleosis, thyroid disease, lupus, rheumatism, seizure or stroke. Positive  depression.  Past Surgical History  Procedure Laterality Date  . Cardiac surgery: ASD repair age 44     . Tonsillectomy    Eye surgery both eyes when she was 44 or 44 years old. She has had a number of dental extractions in the past. Double hernia surgery shortly after birth.  Allergies: Allergies  Allergen Reactions  . Duraflex     Medications:  Current outpatient prescriptions:  .  acetaminophen (TYLENOL) 325 MG tablet, Take 2 tablets (650 mg total) by mouth every 4 (four) hours as needed. (Patient not taking: Reported on 04/23/2014), Disp: 30 tablet, Rfl: 0 .  albuterol (PROVENTIL HFA;VENTOLIN HFA) 108 (90 BASE) MCG/ACT inhaler, Inhale 1 puff into the lungs as needed for wheezing or shortness of breath., Disp: , Rfl:  .  budesonide (PULMICORT) 180 MCG/ACT inhaler, Inhale 1 puff into the lungs 2 (two) times daily., Disp: , Rfl:  .  Calcium-Magnesium-Vitamin D (CALCIUM MAGNESIUM PO), Take 1 tablet by mouth daily., Disp: , Rfl:  .  cetirizine (ZYRTEC) 10 MG tablet, Take 10 mg by mouth 2 (two) times daily., Disp: , Rfl:  .  desogestrel-ethinyl estradiol (ENSKYCE) 0.15-30 MG-MCG tablet, Take 1 tablet by mouth daily., Disp: , Rfl:  .  furosemide (LASIX) 20 MG tablet, Take 1 tablet (20 mg total) by mouth daily., Disp: 30 tablet, Rfl: 2 .  guaiFENesin (MUCINEX) 600 MG 12 hr tablet, Take 600 mg by mouth 2 (two)  times daily as needed., Disp: , Rfl:  .  L-Lysine 1000 MG TABS, Take 1,000 mg by mouth as needed., Disp: , Rfl:  .  montelukast (SINGULAIR) 10 MG tablet, Take 10 mg by mouth at bedtime., Disp: , Rfl:  .  Multiple Vitamin (MULTIVITAMIN) tablet, Take 1 tablet by mouth daily., Disp: , Rfl:  .  potassium chloride SA (K-DUR,KLOR-CON) 20 MEQ tablet, Take 1 tablet (20 mEq total) by mouth daily., Disp: 30 tablet, Rfl: 6 .  rOPINIRole (REQUIP) 1 MG tablet, Take 1 mg by mouth at bedtime., Disp: , Rfl:  .  sertraline (ZOLOFT) 100 MG tablet, Take 200 mg by mouth daily., Disp: , Rfl:  .  triamcinolone  (NASACORT ALLERGY 24HR) 55 MCG/ACT AERO nasal inhaler, Place 1 spray into the nose daily., Disp: , Rfl:  .  warfarin (COUMADIN) 5 MG tablet, Take 5 mg by mouth daily. .7  TUES THURS SAT 10 MG MON WED FRI, Disp: , Rfl:   Social History: Currently unemployed. Single. Never pregnant. Recently worked as a Firefighter. she has never smoked.  She reports that she drinks alcohol very rarely-just a occasional glass of wine.  she does not use illicit drugs.  Family History: Family History  Problem Relation Age of Onset  . CAD Father   . Hypertension Father   . Cancer - Prostate Father   . Cancer Father   . Heart disease Father   . Asthma Sister   . Diabetes Mother   . Autoimmune disease Mother     Review of Systems: See history of present illness. She denies any chest pain, chest pressure, palpitations. She states that she used to have a murmur but over time it resolved. She admits to easy bruisability. Cold intolerance. She had a recent mammogram 03/30/2014 which was normal. She's had no change in bowel habit. No hematochezia or melena. Her menses are back to her baseline. No menorrhagia. No hematuria. Remaining ROS negative.  Physical Exam: Blood pressure 126/62, pulse 88, temperature 98.2 F (36.8 C), temperature source Oral, height 5' 2.5" (1.588 m), weight 197 lb 14.4 oz (89.767 kg), SpO2 97 %. Wt Readings from Last 3 Encounters:  05/19/14 197 lb 14.4 oz (89.767 kg)  04/23/14 196 lb (88.905 kg)  10/20/13 185 lb 6.4 oz (84.097 kg)     General appearance: Overweight Caucasian woman HENNT: Pharynx no erythema, exudate, mass, or ulcer. No thyromegaly or thyroid nodules Lymph nodes: No cervical, supraclavicular, or axillary lymphadenopathy Breasts:  Lungs: Clear to auscultation, resonant to percussion throughout Heart: Regular rhythm, no murmur, no gallop, no rub, no click, no edema Abdomen: Soft, nontender, normal bowel sounds, no mass, no  organomegaly Extremities: No edema, no calf tenderness. Right calf 35 cm, left calf 33.5 cm. Musculoskeletal: no joint deformities GU: Vascular: Carotid pulses 2+, no bruits,  Neurologic: Alert, oriented, PERRLA, optic discs sharp and vessels normal on the left, fundus not well visualized on the right no hemorrhage or exudate, cranial nerves grossly normal, motor strength 5 over 5, reflexes 1+ symmetric upper extremity, absent symmetric lower extremity, upper body coordination normal, gait normal, Skin: No rash or ecchymosis    Lab Results: Lab Results  Component Value Date   WBC 9.6 05/19/2014   HGB 14.0 05/19/2014   HCT 43.2 05/19/2014   MCV 88.3 05/19/2014   PLT 313 05/19/2014     Chemistry      Component Value Date/Time   NA 139 10/29/2013 1247   K 3.8 10/29/2013 1247  CL 106 10/29/2013 1247   CO2 27 10/29/2013 1247   BUN 10 10/29/2013 1247   CREATININE 0.8 10/29/2013 1247      Component Value Date/Time   CALCIUM 9.2 10/29/2013 1247    D-dimer: Less than 0.27 05/19/2014   Impression and Plan: Right lower extremity DVT Risk factors primarily related to long-term oral contraceptive use with possible contributing features of obesity and chronic asthma. No previous thrombotic events. No signs or symptoms of a collagen vascular disorder. No family history of clotting.  Recommendation: At present I'm going to consider this a provoked event related to her oral contraceptive use. I am going to go ahead and check some baseline labs including d-dimer, factor 5 Leiden and prothrombin gene mutation status, antiphospholipid antibody status, and anti-thrombin. Unless she is positive for presence of antiphospholipid antibodies which I doubt clinically, irrespective of whatever else we might turn up on the other labs,  I'm going to recommend just 3 months total anticoagulation. If she is found to have a factor V Leiden or prothrombin gene mutation, this will add more support to a  provoked event related to the contraceptive. It will not change the recommendation for duration of anticoagulation.        Annia Belt, MD 05/19/2014, 6:21 PM

## 2014-05-19 NOTE — Patient Instructions (Signed)
To lab today We will call in about a week when results are available Return visit to Hematologist in 6 months

## 2014-05-21 LAB — BETA-2-GLYCOPROTEIN I ABS, IGG/M/A: Beta-2 Glyco I IgG: <9 GPI IgG units (ref 0–20)

## 2014-05-21 LAB — CARDIOLIPIN ANTIBODIES, IGG, IGM, IGA
Anticardiolipin IgA: <9 APL U/mL — ABNORMAL LOW (ref 0–11)
Anticardiolipin IgG: <9 GPL U/mL — ABNORMAL LOW (ref 0–14)
Anticardiolipin IgM: 13 MPL U/mL — ABNORMAL HIGH (ref 0–12)

## 2014-05-22 LAB — LUPUS ANTICOAGULANT PANEL
DRVVT: 49.2 s (ref 0.0–55.1)
PTT Lupus Anticoagulant: 50.2 s — ABNORMAL HIGH (ref 0.0–50.0)

## 2014-05-22 LAB — FACTOR 5 LEIDEN

## 2014-05-22 LAB — PTT-LA MIX: PTT-LA MIX: 41.3 s (ref 0.0–50.0)

## 2014-05-22 LAB — PTT-LA INCUB MIX: PTT-LA INCUB MIX: 42.7 s (ref 0.0–50.0)

## 2014-05-25 LAB — PROTHROMBIN GENE MUTATION

## 2014-05-27 ENCOUNTER — Telehealth: Payer: Self-pay | Admitting: *Deleted

## 2014-05-27 NOTE — Telephone Encounter (Signed)
Pt called/informed "OK to stop blood thinner after she completes total of 3 months of therapy from the time she started" ; pt voiced understanding. Also informed " special blood tests for inherited or acquired  Risk factors for clotting all normal". Per Dr Cyndie ChimeGranfortuna.

## 2014-05-27 NOTE — Telephone Encounter (Signed)
-----   Message from Levert FeinsteinJames M Granfortuna, MD sent at 05/27/2014 11:17 AM EST ----- Call pt: OK to stop blood thinner after she completes total of 3 months of therapy from the time she started

## 2014-06-04 ENCOUNTER — Telehealth: Payer: Self-pay | Admitting: *Deleted

## 2014-06-04 NOTE — Telephone Encounter (Signed)
Call from pt - wanted to know if it's ok to stop Warfarin; stated she had 3 months of therapy. Informed her if she received my call on the 10th to stop her blood thinner after 3 months of therapy per Dr Cyndie ChimeGranfortuna - stated she did but her doctor wanted to be sure. Told her again it's ok to stop Warfarin if she had completed 3 months from the time she had started - which she stated she had.

## 2014-07-13 ENCOUNTER — Other Ambulatory Visit: Payer: Self-pay | Admitting: Cardiology

## 2014-07-13 NOTE — Telephone Encounter (Signed)
Charlotte Reichertraci R Turner, MD at 04/23/2014 3:15 PM  furosemide (LASIX) 20 MG tabletTake 1 tablet (20 mg total) by mouth daily  Patient Instructions     Your physician recommends that you continue on your current medications as directed. Please refer to the Current Medication list given to you today

## 2014-08-05 ENCOUNTER — Encounter: Payer: Self-pay | Admitting: Cardiology

## 2014-08-12 ENCOUNTER — Encounter: Payer: Self-pay | Admitting: Cardiology

## 2014-09-27 IMAGING — CR DG CHEST 2V
2 series · 2 of 2 positions shown · non-contrast
Comparison: 11/24/2012

CLINICAL DATA: Chest pain and shortness of breath.

CHEST - 2 VIEW

[w chest pa]
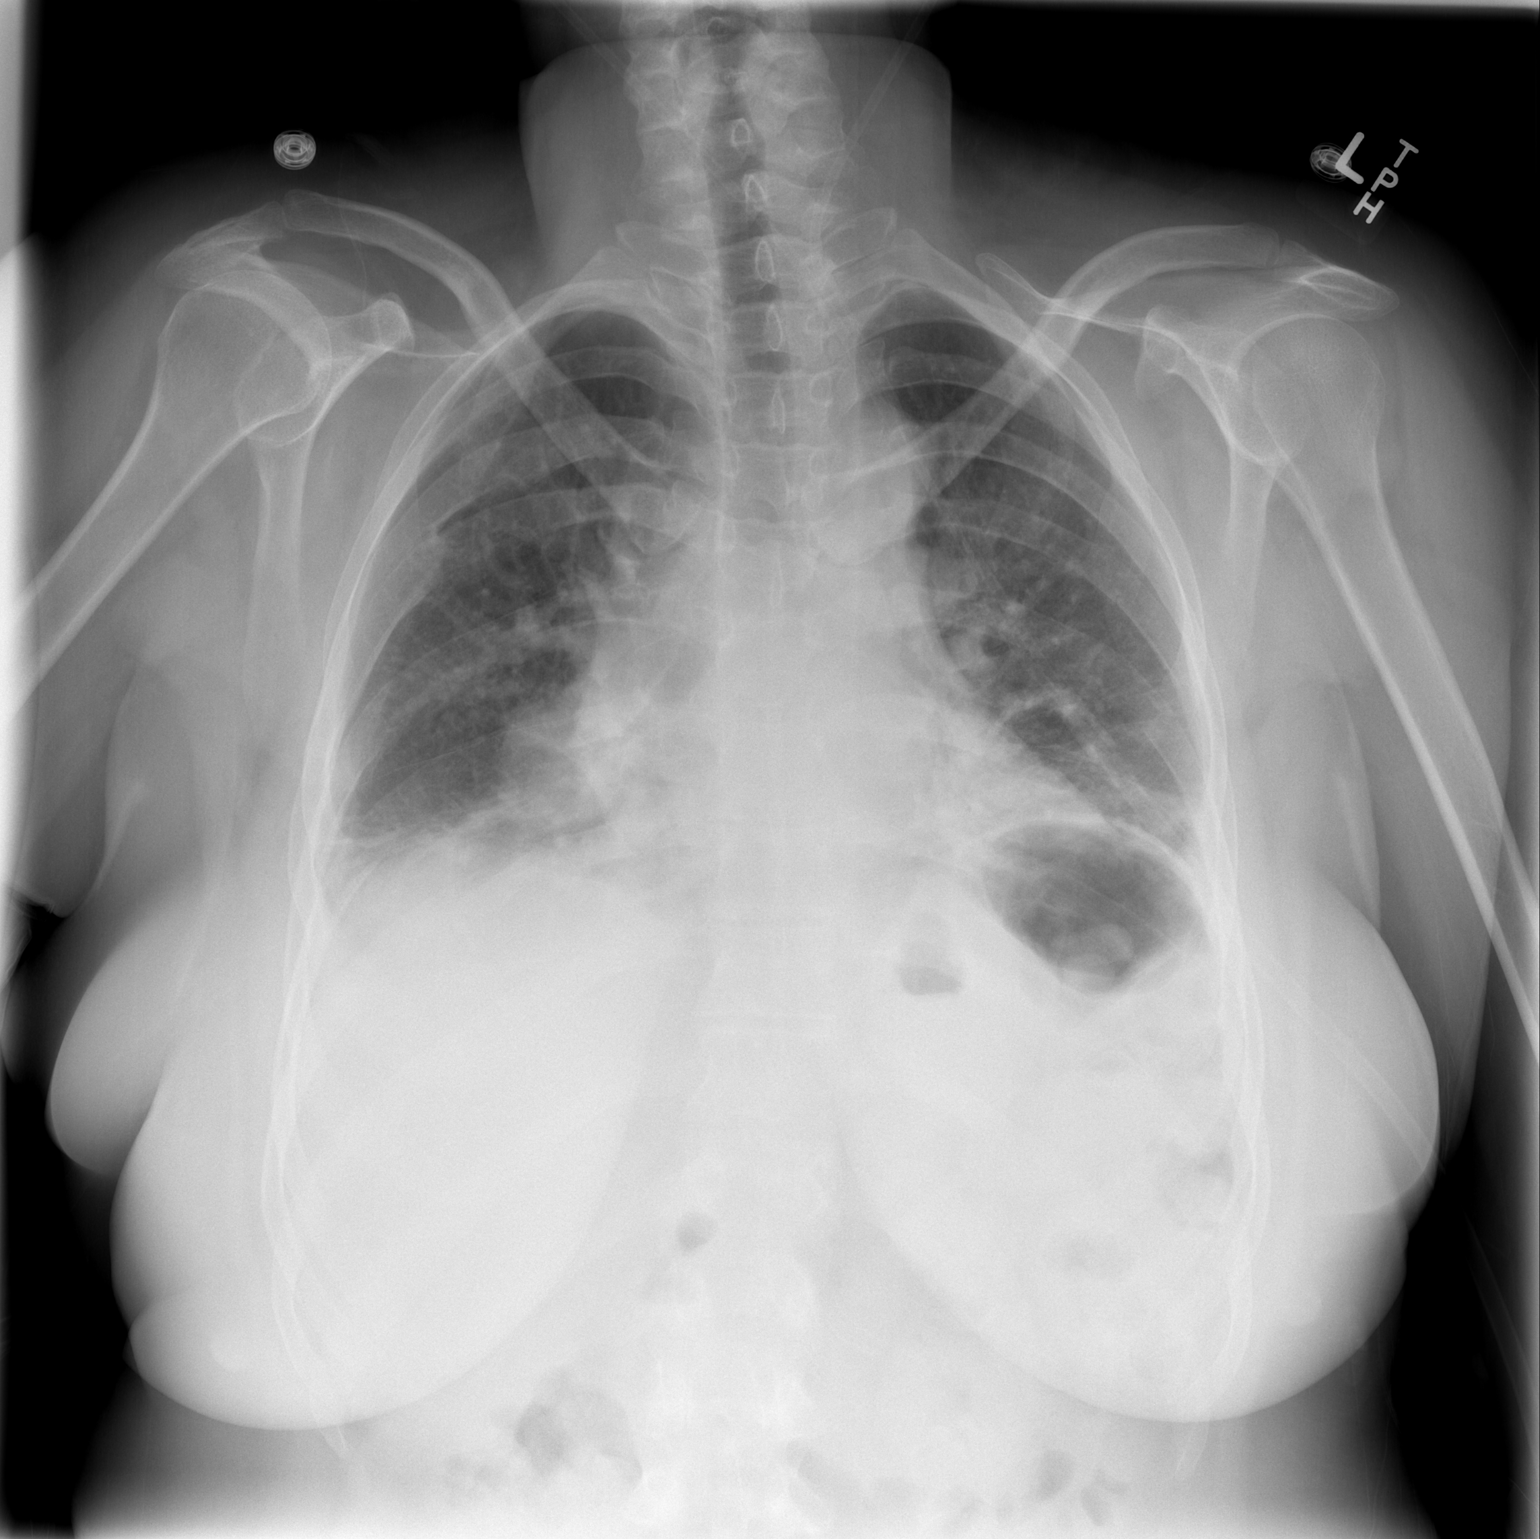

[w chest lat]
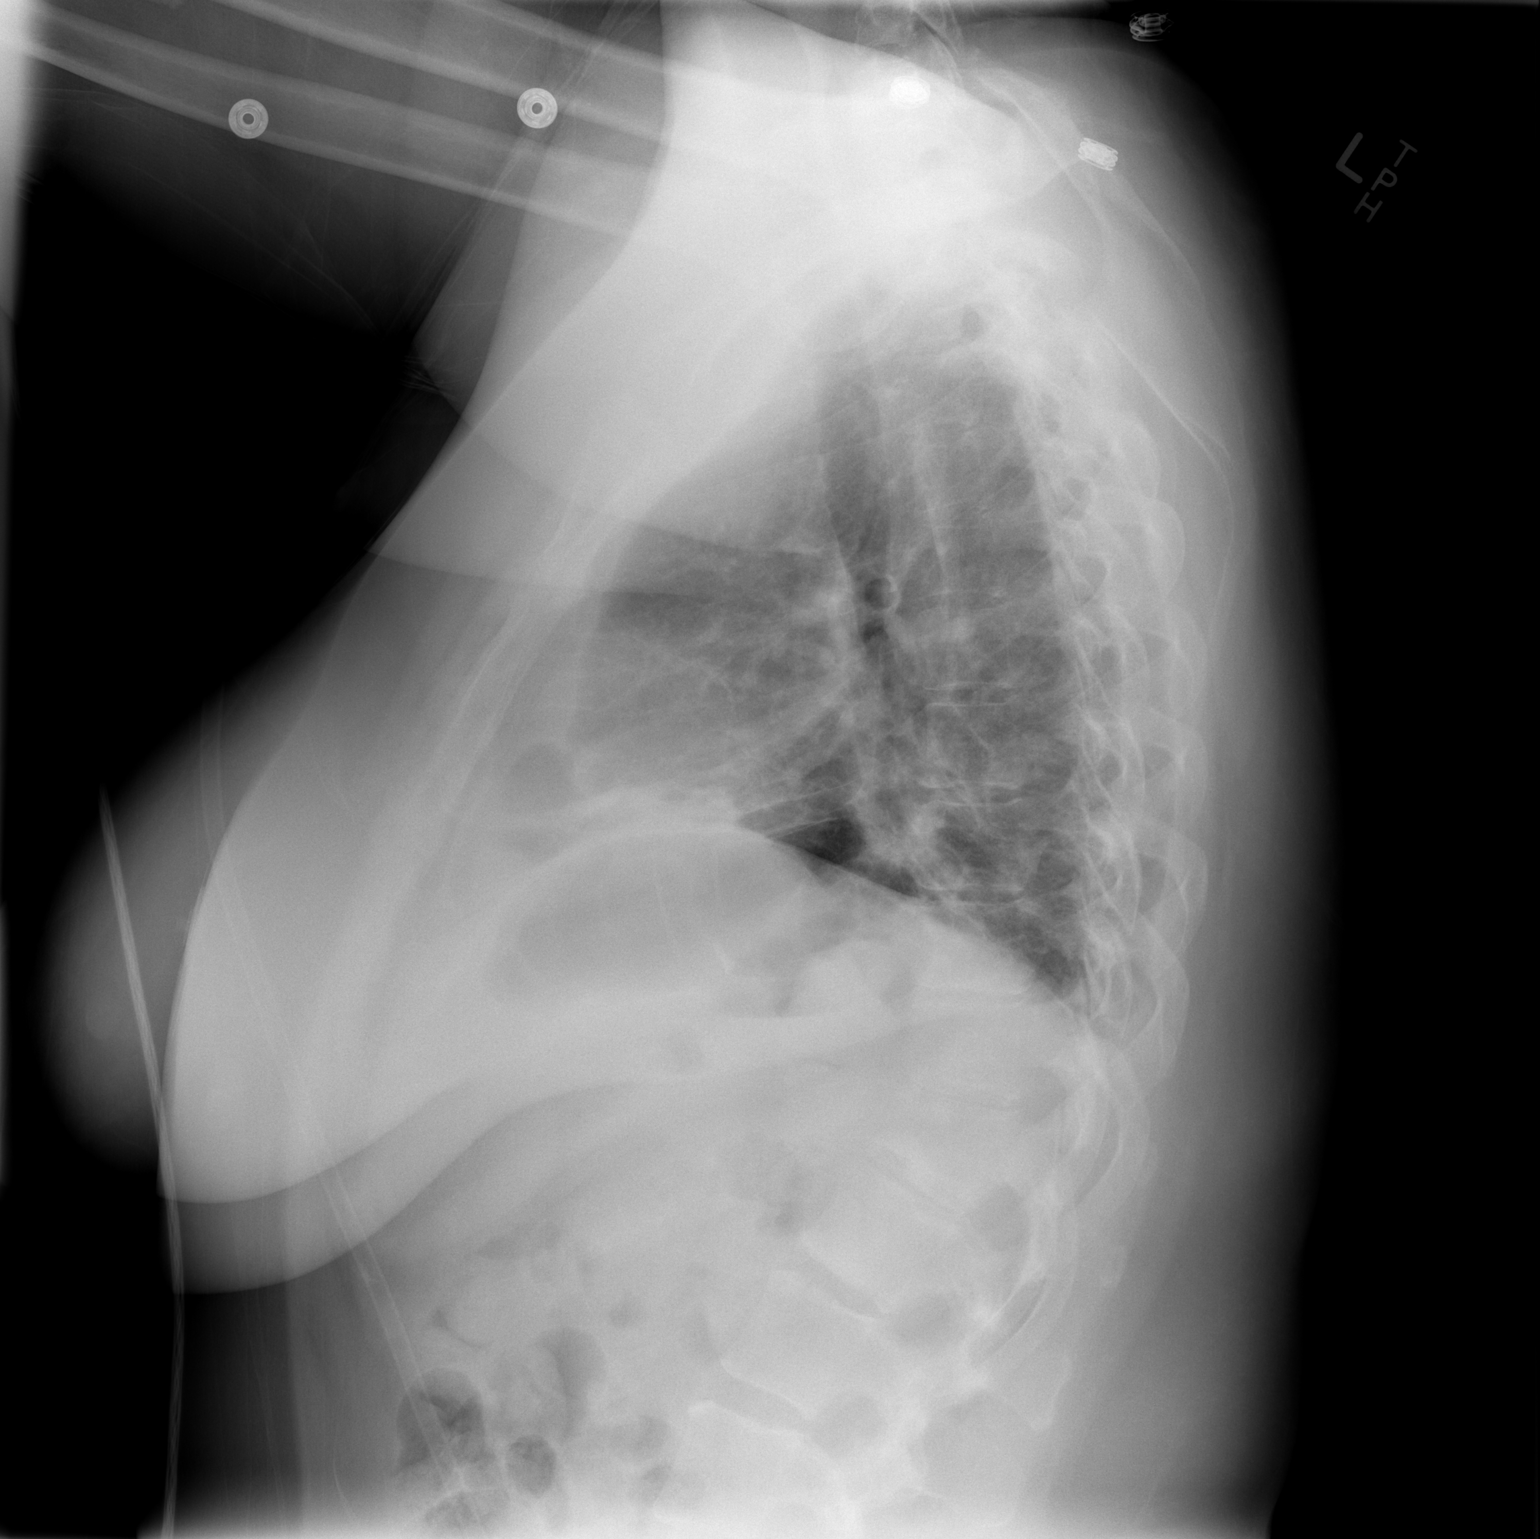

[2 of 2 positions shown; findings below may reference images not displayed]

FINDINGS: Mild cardiomegaly.  Central basilar edema.  Bibasilar
atelectasis.  No pneumothorax.  No pleural effusion.
IMPRESSION: Mild CHF.  Bibasilar atelectasis.

## 2014-09-27 IMAGING — CR DG CHEST 1V PORT
1 series · 1 of 1 positions shown · non-contrast
Comparison: None

CLINICAL DATA: Short of breath

PORTABLE CHEST - 1 VIEW

[AP]
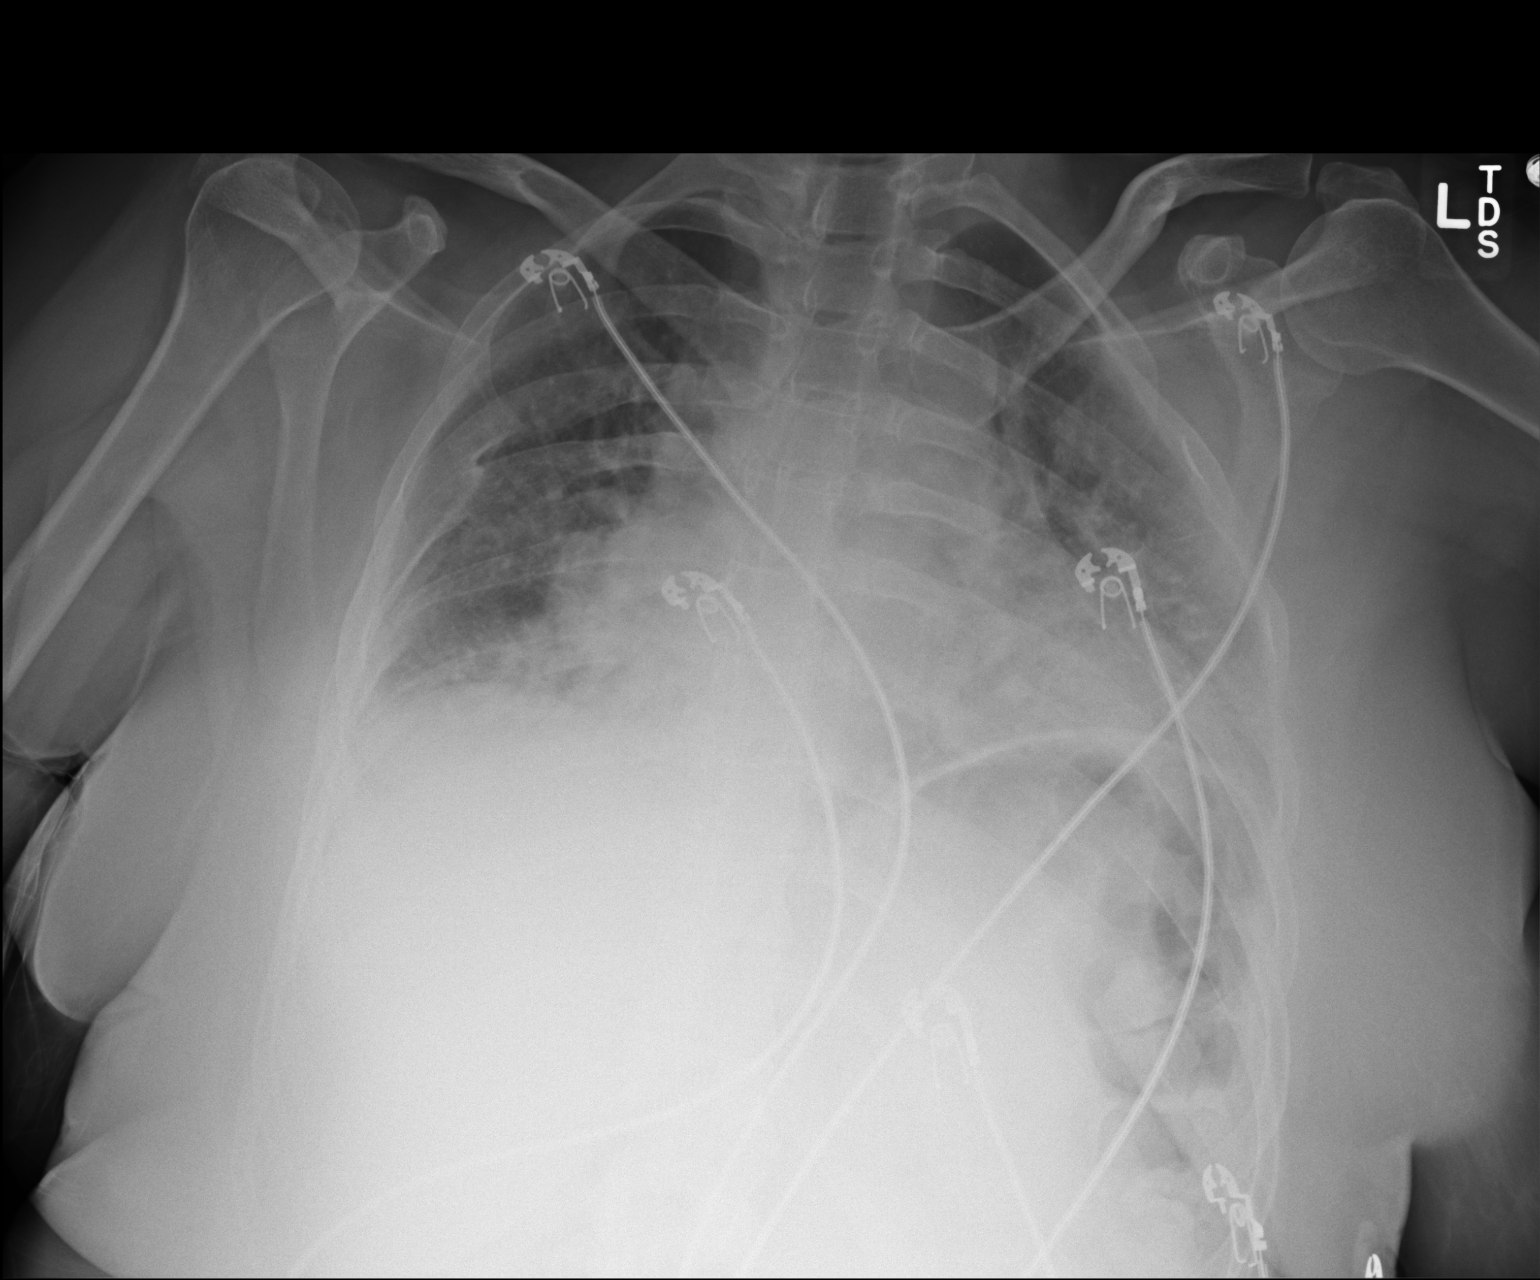

[1 of 1 positions shown; findings below may reference images not displayed]

FINDINGS: Cardiac enlargement with congestive heart failure.  There
is bilateral edema and small effusions.  Right rib deformities are
noted which may be congenital or due to old injury.
IMPRESSION: Congestive heart failure with pulmonary edema and small pleural
effusions.

## 2014-10-26 ENCOUNTER — Encounter: Payer: Self-pay | Admitting: Cardiology

## 2014-10-26 ENCOUNTER — Ambulatory Visit (INDEPENDENT_AMBULATORY_CARE_PROVIDER_SITE_OTHER): Payer: No Typology Code available for payment source | Admitting: Cardiology

## 2014-10-26 VITALS — BP 111/73 | HR 70 | Ht 62.5 in | Wt 194.8 lb

## 2014-10-26 DIAGNOSIS — G4733 Obstructive sleep apnea (adult) (pediatric): Secondary | ICD-10-CM

## 2014-10-26 DIAGNOSIS — I5032 Chronic diastolic (congestive) heart failure: Secondary | ICD-10-CM | POA: Diagnosis not present

## 2014-10-26 DIAGNOSIS — Q211 Atrial septal defect, unspecified: Secondary | ICD-10-CM

## 2014-10-26 LAB — BASIC METABOLIC PANEL
BUN: 11 mg/dL (ref 6–23)
CALCIUM: 9.5 mg/dL (ref 8.4–10.5)
CO2: 25 mEq/L (ref 19–32)
CREATININE: 0.8 mg/dL (ref 0.40–1.20)
Chloride: 106 mEq/L (ref 96–112)
GFR: 83.19 mL/min (ref >60.00)
GLUCOSE: 85 mg/dL (ref 70–99)
POTASSIUM: 3.9 meq/L (ref 3.5–5.1)
Sodium: 140 mEq/L (ref 135–145)

## 2014-10-26 NOTE — Progress Notes (Signed)
Cardiology Office Note   Date:  10/26/2014   ID:  Charlotte Chapman, DOB [birth year], MRN [id number]  PCP:  Tawanna Solo, MD    Chief Complaint  Patient presents with  . Follow-up    OSA      History of Present Illness: Charlotte Chapman is a 44 y.o. female with a history of ASD s/p repair, chronic diastolic CHF, OSA, obesity and chronic diastolic CHF who presents today for followup. . She is doing well. She denies any chest pain, SOB, DOE, dizziness, palpitations or syncope. She has chronic LE edema which is stable.  She tolerates her PAP and tolerates the nasal pillow mask well and feels the pressure is adequate. She feels rested in the am for the most part if she has slept well the night before and has no daytime sleepiness. She walks with her dog for exercise.     Past Medical History  Diagnosis Date  . Atrial septal defect     s/p repair  . Heart murmur   . OSA (obstructive sleep apnea)   . Asthma     MILD  . Social anxiety disorder   . Acne   . Chronic ear infection   . Allergic rhinitis   . Obesity   . RLS (restless legs syndrome)     Low ferritin 6/12 10/13  . Chronic diastolic CHF (congestive heart failure)   . DVT (deep venous thrombosis)     Past Surgical History  Procedure Laterality Date  . Cardiac surgery    . Tonsillectomy       Current Outpatient Prescriptions  Medication Sig Dispense Refill  . acetaminophen (TYLENOL) 325 MG tablet Take 650 mg by mouth every 6 (six) hours as needed for moderate pain.    Marland Kitchen albuterol (PROVENTIL HFA;VENTOLIN HFA) 108 (90 BASE) MCG/ACT inhaler Inhale 1 puff into the lungs as needed for wheezing or shortness of breath.    . budesonide (PULMICORT) 180 MCG/ACT inhaler Inhale 1 puff into the lungs 2 (two) times daily.    . Calcium-Magnesium-Vitamin D (CALCIUM MAGNESIUM PO) Take 1 tablet by mouth daily.    . cetirizine (ZYRTEC) 10 MG tablet Take 10 mg by mouth 2 (two) times daily.    . furosemide (LASIX)  20 MG tablet TAKE ONE TABLET BY MOUTH ONCE DAILY 90 tablet 2  . guaiFENesin (MUCINEX) 600 MG 12 hr tablet Take 600 mg by mouth 2 (two) times daily as needed.    Marland Kitchen L-Lysine 1000 MG TABS Take 1,000 mg by mouth as needed.    . montelukast (SINGULAIR) 10 MG tablet Take 10 mg by mouth at bedtime.    . Multiple Vitamin (MULTIVITAMIN) tablet Take 1 tablet by mouth daily.    . potassium chloride SA (K-DUR,KLOR-CON) 20 MEQ tablet Take 1 tablet (20 mEq total) by mouth daily. 30 tablet 6  . rOPINIRole (REQUIP) 1 MG tablet Take 1 mg by mouth at bedtime.    . sertraline (ZOLOFT) 100 MG tablet Take 200 mg by mouth daily.    Marland Kitchen triamcinolone (NASACORT ALLERGY 24HR) 55 MCG/ACT AERO nasal inhaler Place 1 spray into the nose daily.    Marland Kitchen warfarin (COUMADIN) 5 MG tablet Take 5 mg by mouth daily. .7  TUES THURS SAT 10 MG MON WED FRI     No current facility-administered medications for this visit.    Allergies:   Duraflex    Social History:  The patient  reports that she has never smoked. She does not have any smokeless tobacco history on file. She reports that she drinks alcohol. She reports that she does not use illicit drugs.   Family History:  The patient's family history includes Asthma in her sister; Autoimmune disease in her mother; CAD in her father; Cancer in her father; Cancer - Prostate in her father; Diabetes in her mother; Heart disease in her father; Hypertension in her father.    ROS:  Please see the history of present illness.   Otherwise, review of systems are positive for none.   All other systems are reviewed and negative.    PHYSICAL EXAM: VS:  Ht 5' 2.5" (1.588 m)  Wt 194 lb 12.8 oz (88.361 kg)  BMI 35.04 kg/m2  LMP 10/26/2014 , BMI Body mass index is 35.04 kg/(m^2). GEN: Well nourished, well developed, in no acute distress HEENT: normal Neck: no JVD, carotid bruits, or masses Cardiac: RRR; no murmurs, rubs, or gallops,no edema  Respiratory:  clear to auscultation bilaterally, normal  work of breathing GI: soft, nontender, nondistended, + BS MS: no deformity or atrophy Skin: warm and dry, no rash Neuro:  Strength and sensation are intact Psych: euthymic mood, full affect   EKG:  EKG is not ordered today.    Recent Labs: 10/29/2013: BUN 10; Creatinine, Ser 0.8; Potassium 3.8; Sodium 139 05/19/2014: Hemoglobin 14.0; Platelets 313    Lipid Panel No results found for: CHOL, TRIG, HDL, CHOLHDL, VLDL, LDLCALC, LDLDIRECT    Wt Readings from Last 3 Encounters:  10/26/14 194 lb 12.8 oz (88.361 kg)  05/19/14 197 lb 14.4 oz (89.767 kg)  04/23/14 196 lb (88.905 kg)    ASSESSMENT AND PLAN:  1. Chronic diastolic CHF- appears compensated - continue Lasix. Check BMET today 2. ASD s/p repair       3. OSA on CPAP - I will get a d/l from the DME    Current medicines are reviewed at length with the patient today.  The patient does not have concerns regarding medicines.  The following changes have been made:  no change  Labs/ tests ordered today: See above Assessment and Plan No orders of the defined types were placed in this encounter.     Disposition:   FU with me in 1 year  Signed, Sueanne Margarita, MD  10/26/2014 3:06 PM    [facility] [place], [place], [place]  [address] Phone: [phone]; Fax: [phone]

## 2014-10-26 NOTE — Patient Instructions (Addendum)
Medication Instructions:  No changes today  Labwork: BMET today  Testing/Procedures: None today  Follow-Up: Your physician wants you to follow-up in: 1 year with Dr Mayford Knifeurner. (July 2017). You will receive a reminder letter in the mail two months in advance. If you don't receive a letter, please call our office to schedule the follow-up appointment.     Thank you for choosing [facility]!!

## 2014-11-03 ENCOUNTER — Encounter: Payer: Self-pay | Admitting: Cardiology

## 2014-12-08 ENCOUNTER — Ambulatory Visit: Payer: No Typology Code available for payment source | Admitting: Oncology

## 2014-12-22 ENCOUNTER — Other Ambulatory Visit: Payer: Self-pay | Admitting: Cardiology

## 2015-04-07 ENCOUNTER — Other Ambulatory Visit: Payer: Self-pay | Admitting: Cardiology

## 2015-05-07 ENCOUNTER — Other Ambulatory Visit: Payer: Self-pay | Admitting: Allergy and Immunology

## 2015-05-31 ENCOUNTER — Other Ambulatory Visit: Payer: Self-pay | Admitting: *Deleted

## 2015-06-01 NOTE — Telephone Encounter (Signed)
Tried to reach patient. No answer and vm was unidentified.

## 2015-06-01 NOTE — Telephone Encounter (Signed)
Tried to reach patient again

## 2015-06-03 ENCOUNTER — Telehealth: Payer: Self-pay | Admitting: Cardiology

## 2015-06-03 NOTE — Telephone Encounter (Signed)
°*  STAT* If patient is at the pharmacy, call can be transferred to refill team.   1. Which medications need to be refilled? (please list name of each medication and dose if known) Pt need a new prescription- Ropinirole 2.Which pharmacy/location (including street and city if local pharmacy) is medication to be sent to?[phone]  3. Do they need a 30 day or 90 day supply? 30 and refills

## 2015-06-03 NOTE — Telephone Encounter (Signed)
Called pt back and left a VM informing pt that she would have to contact her PCP to get a refill on Ropinirole (Requip) and if she has any other problems, questions or concerns to call our office.

## 2015-08-03 ENCOUNTER — Other Ambulatory Visit: Payer: Self-pay | Admitting: Allergy and Immunology

## 2015-08-10 ENCOUNTER — Other Ambulatory Visit: Payer: Self-pay | Admitting: Allergy and Immunology

## 2015-08-10 ENCOUNTER — Other Ambulatory Visit: Payer: Self-pay

## 2015-08-10 MED ORDER — MONTELUKAST SODIUM 10 MG PO TABS: 10.0000 mg | ORAL_TABLET | Freq: Every day | ORAL | Status: DC

## 2015-08-10 NOTE — Telephone Encounter (Signed)
Informed patient that we sent in the script and to make sure to keep her appointment.

## 2015-08-10 NOTE — Telephone Encounter (Signed)
Pt called and needs to have [facility]elukuast [facility] [address] and she made appointment for may 30 when her ins goes into affect.

## 2015-09-06 DIAGNOSIS — J019 Acute sinusitis, unspecified: Secondary | ICD-10-CM | POA: Diagnosis not present

## 2015-09-08 ENCOUNTER — Other Ambulatory Visit: Payer: Self-pay | Admitting: Allergy and Immunology

## 2015-09-14 ENCOUNTER — Ambulatory Visit (INDEPENDENT_AMBULATORY_CARE_PROVIDER_SITE_OTHER): Payer: BLUE CROSS/BLUE SHIELD | Admitting: Allergy and Immunology

## 2015-09-14 ENCOUNTER — Encounter: Payer: Self-pay | Admitting: Allergy and Immunology

## 2015-09-14 DIAGNOSIS — H1010 Acute atopic conjunctivitis, unspecified eye: Secondary | ICD-10-CM

## 2015-09-14 DIAGNOSIS — J4530 Mild persistent asthma, uncomplicated: Secondary | ICD-10-CM | POA: Diagnosis not present

## 2015-09-14 DIAGNOSIS — J309 Allergic rhinitis, unspecified: Secondary | ICD-10-CM | POA: Diagnosis not present

## 2015-09-14 NOTE — Progress Notes (Signed)
Follow-up Note  Referring Provider: Kathyrn Lass, MD Primary Provider: Tawanna Solo, MD Date of Office Visit: 09/14/2015  Subjective:   Charlotte Chapman (DOB: Nov 18, 1970) is a 45 y.o. female who returns to the [facility] on 09/14/2015 in re-evaluation of the following:  HPI: Charlotte Chapman returns to this clinic in evaluation of her asthma and allergic rhinoconjunctivitis. It is been approximately 1 year since I've last seen her in his clinic. She has not had any flareups of her asthma while consistently using an inhaled steroid. She has not required a systemic steroid. She does not exercise to any large degree but rarely uses a short acting bronchodilator. Her nose has been doing quite well while using over-the-counter Nasacort or Flonase. She has not had any episodes of sinusitis other than one episode about one month ago that required an antibiotic. She did obtain a flu vaccine last year.    Medication List           acetaminophen 325 MG tablet  Commonly known as:  TYLENOL  Take 650 mg by mouth every 6 (six) hours as needed for moderate pain.     albuterol 108 (90 Base) MCG/ACT inhaler  Commonly known as:  PROVENTIL HFA;VENTOLIN HFA  Inhale 1 puff into the lungs as needed for wheezing or shortness of breath.     budesonide 180 MCG/ACT inhaler  Commonly known as:  PULMICORT  Inhale 1 puff into the lungs 2 (two) times daily.     CALCIUM MAGNESIUM PO  Take 1 tablet by mouth daily.     cetirizine 10 MG tablet  Commonly known as:  ZYRTEC  Take 10 mg by mouth 2 (two) times daily.     fluticasone 50 MCG/ACT nasal spray  Commonly known as:  FLONASE  Place 2 sprays into both nostrils daily.     furosemide 20 MG tablet  Commonly known as:  LASIX  TAKE ONE TABLET BY MOUTH ONCE DAILY     guaiFENesin 600 MG 12 hr tablet  Commonly known as:  MUCINEX  Take 600 mg by mouth 2 (two) times daily as needed.     KLOR-CON M20 20 MEQ tablet  Generic drug:  potassium chloride  SA  TAKE ONE TABLET BY MOUTH ONCE DAILY     L-Lysine 1000 MG Tabs  Take 1,000 mg by mouth as needed.     montelukast 10 MG tablet  Commonly known as:  SINGULAIR  Take 1 tablet (10 mg total) by mouth daily.     multivitamin tablet  Take 1 tablet by mouth daily.     NASACORT ALLERGY 24HR 55 MCG/ACT Aero nasal inhaler  Generic drug:  triamcinolone  Place 1 spray into the nose daily.     rOPINIRole 1 MG tablet  Commonly known as:  REQUIP  Take 1 mg by mouth at bedtime.     sertraline 100 MG tablet  Commonly known as:  ZOLOFT  Take 200 mg by mouth daily.        Past Medical History  Diagnosis Date  . Atrial septal defect     s/p repair  . Heart murmur   . OSA (obstructive sleep apnea)   . Asthma     MILD  . Social anxiety disorder   . Acne   . Chronic ear infection   . Allergic rhinitis   . Obesity   . RLS (restless legs syndrome)     Low ferritin 6/12 10/13  . Chronic diastolic CHF (congestive  heart failure) ([place])   . DVT (deep venous thrombosis) [facility])     Past Surgical History  Procedure Laterality Date  . Cardiac surgery    . Tonsillectomy      Allergies  Allergen Reactions  . Duraflex     Review of systems negative except as noted in HPI / PMHx or noted below:  Review of Systems  Constitutional: Negative.   HENT: Negative.   Eyes: Negative.   Respiratory: Negative.   Cardiovascular: Negative.   Gastrointestinal: Negative.   Genitourinary: Negative.   Musculoskeletal: Negative.   Skin: Negative.   Neurological: Negative.   Endo/Heme/Allergies: Negative.   Psychiatric/Behavioral: Negative.      Objective:   Filed Vitals:   09/14/15 1733  BP: 118/80  Pulse: 72  Resp: 20   Height: _0  (160 cm)  Weight: 199 lb (90.266 kg)   Physical Exam  Constitutional: She is well-developed, well-nourished, and in no distress.  HENT:  Head: Normocephalic.  Right Ear: Tympanic membrane, external ear and ear canal normal.  Left Ear: Tympanic  membrane, external ear and ear canal normal.  Nose: Nose normal. No mucosal edema or rhinorrhea.  Mouth/Throat: Uvula is midline, oropharynx is clear and moist and mucous membranes are normal. No oropharyngeal exudate.  Eyes: Conjunctivae are normal.  Neck: Trachea normal. No tracheal tenderness present. No tracheal deviation present. No thyromegaly present.  Cardiovascular: Normal rate, regular rhythm, S1 normal, S2 normal and normal heart sounds.   No murmur heard. Pulmonary/Chest: Breath sounds normal. No stridor. No respiratory distress. She has no wheezes. She has no rales.  Musculoskeletal: She exhibits no edema.  Lymphadenopathy:       Head (right side): No tonsillar adenopathy present.       Head (left side): No tonsillar adenopathy present.    She has no cervical adenopathy.  Neurological: She is alert. Gait normal.  Skin: No rash noted. She is not diaphoretic. No erythema. Nails show no clubbing.  Psychiatric: Mood and affect normal.    Diagnostics:    Spirometry was performed and demonstrated an FEV1 of 1.74 at 65 % of predicted.  The patient had an Asthma Control Test with the following results:  .    Assessment and Plan:   1. Asthma, well controlled, mild persistent   2. Allergic rhinoconjunctivitis     1. Continue Pulmicort 180 2 inhalations one time per day. Increase to 3 inhalations 3 times per day as part of "action plan" for asthma flare up  2. Continue OTC Nasacort or Flonase one-2 sprays each nostril one time per day  3. Continue ProAir HFA 2 puffs every 4-6 hours as needed  4. Discontinue montelukast  5. Use antihistamine if needed  6. Obtain fall flu vaccine  7. Return to clinic in 1 year or earlier if problem   Charlotte Chapman has done well over the course the past year. Her spirometry is depressed but it is always been depressed and I suspect that she has some form of fixed physiologic deficit giving rise to this abnormal spirometry. Given the fact that she has  no asthmatic symptoms and rarely uses a short acting bronchodilator and can walk without any difficulty I suspect that this spirometric abnormality will probably not change to any large degree regardless of therapy. We'll continue to have her use Pulmicort and she can use a nasal steroid and we'll see if she does well as we discontinue montelukast. She will contact me should she have significant problems with elimination of  this medication but otherwise we will see her back in this clinic in 1 year or earlier if problem  Allena Katz, MD [place]

## 2015-09-14 NOTE — Patient Instructions (Addendum)
  1. Continue Pulmicort 180 2 inhalations one time per day. Increase to 3 inhalations 3 times per day as part of "action plan" for asthma flare up  2. Continue OTC Nasacort or Flonase one-2 sprays each nostril one time per day  3. Continue ProAir HFA 2 puffs every 4-6 hours as needed  4. Discontinue montelukast  5. Use antihistamine if needed  6. Obtain fall flu vaccine  7. Return to clinic in 1 year or earlier if problem

## 2015-09-16 ENCOUNTER — Other Ambulatory Visit: Payer: Self-pay

## 2015-09-16 MED ORDER — BUDESONIDE 180 MCG/ACT IN AEPB: 2.0000 | INHALATION_SPRAY | Freq: Every day | RESPIRATORY_TRACT | Status: DC

## 2015-09-16 MED ORDER — ALBUTEROL SULFATE HFA 108 (90 BASE) MCG/ACT IN AERS: 2.0000 | INHALATION_SPRAY | RESPIRATORY_TRACT | Status: DC | PRN

## 2015-09-16 NOTE — Telephone Encounter (Signed)
[phone] 

## 2015-09-16 NOTE — Telephone Encounter (Addendum)
Inhalers sent in Pulmicort 180 2 puffs every day and Proair HFA patient notified

## 2015-09-16 NOTE — Telephone Encounter (Signed)
Patient seen on 09/14/15 by Dr. Lucie LeatherKozlow. Patient is calling because she went to the pharmacy and there were not any meds for her to pick. She mentioned 2 inhalers she needed to be sent in.   Please Advise  [facility]

## 2015-09-17 ENCOUNTER — Other Ambulatory Visit: Payer: Self-pay | Admitting: *Deleted

## 2015-09-17 NOTE — Telephone Encounter (Signed)
Denied Asmanex 220 to [facility] [phone] patient on different medication

## 2015-09-27 ENCOUNTER — Other Ambulatory Visit: Payer: Self-pay

## 2015-09-27 MED ORDER — ALBUTEROL SULFATE HFA 108 (90 BASE) MCG/ACT IN AERS: 2.0000 | INHALATION_SPRAY | RESPIRATORY_TRACT | Status: DC | PRN

## 2015-09-27 MED ORDER — MOMETASONE FUROATE 220 MCG/INH IN AEPB: 2.0000 | INHALATION_SPRAY | Freq: Every day | RESPIRATORY_TRACT | Status: DC

## 2015-09-27 NOTE — Telephone Encounter (Signed)
Asmanex 220 two one time per day would be a good substitute for pulmicort.

## 2015-09-27 NOTE — Telephone Encounter (Signed)
ProAir HFA sent in

## 2015-09-27 NOTE — Telephone Encounter (Signed)
Spoke to patient states she would like 90 day [facility] but advised she is on Pulmicort not Asmanex states that Dr Lucie LeatherKozlow oked for her to be on any of them advised patient would have to consult with Dr Lucie LeatherKozlow first before we send in Asmanex pt verbalized understanding. Dr Lucie LeatherKozlow please advise

## 2015-09-27 NOTE — Telephone Encounter (Signed)
Asmanex sent in left message for patient to call us back if questions or concerns

## 2015-09-27 NOTE — Telephone Encounter (Signed)
Patient last seen on 09/14/2015 by Dr. Lucie LeatherKozlow. The prices of her inhalers are not affordable at her regular pharmacy. [facility].  Asmanex and Pro Air  Please Advise  Thanks

## 2015-09-28 DIAGNOSIS — H16003 Unspecified corneal ulcer, bilateral: Secondary | ICD-10-CM | POA: Diagnosis not present

## 2015-10-03 ENCOUNTER — Other Ambulatory Visit: Payer: Self-pay | Admitting: Cardiology

## 2015-10-07 ENCOUNTER — Other Ambulatory Visit: Payer: Self-pay

## 2015-10-07 ENCOUNTER — Telehealth: Payer: Self-pay | Admitting: Allergy and Immunology

## 2015-10-07 MED ORDER — MONTELUKAST SODIUM 10 MG PO TABS: 10.0000 mg | ORAL_TABLET | Freq: Every day | ORAL | Status: DC

## 2015-10-07 NOTE — Telephone Encounter (Signed)
SENT IN REFILL AND INFORMED PT

## 2015-10-07 NOTE — Telephone Encounter (Signed)
Pt called and would like to have the singulair called in to her mail order.   [phone]

## 2015-10-27 ENCOUNTER — Encounter: Payer: Self-pay | Admitting: Cardiology

## 2015-10-27 ENCOUNTER — Ambulatory Visit (INDEPENDENT_AMBULATORY_CARE_PROVIDER_SITE_OTHER): Payer: BLUE CROSS/BLUE SHIELD | Admitting: Cardiology

## 2015-10-27 VITALS — BP 134/84 | HR 80 | Ht 63.0 in | Wt 193.6 lb

## 2015-10-27 DIAGNOSIS — I5032 Chronic diastolic (congestive) heart failure: Secondary | ICD-10-CM

## 2015-10-27 DIAGNOSIS — G4733 Obstructive sleep apnea (adult) (pediatric): Secondary | ICD-10-CM | POA: Diagnosis not present

## 2015-10-27 DIAGNOSIS — Q211 Atrial septal defect, unspecified: Secondary | ICD-10-CM

## 2015-10-27 LAB — BASIC METABOLIC PANEL
BUN: 13 mg/dL (ref 7–25)
CALCIUM: 9.4 mg/dL (ref 8.6–10.2)
CHLORIDE: 104 mmol/L (ref 98–110)
CO2: 26 mmol/L (ref 20–31)
CREATININE: 0.82 mg/dL (ref 0.50–1.10)
Glucose, Bld: 76 mg/dL (ref 65–99)
Potassium: 4.4 mmol/L (ref 3.5–5.3)
Sodium: 139 mmol/L (ref 135–146)

## 2015-10-27 NOTE — Patient Instructions (Signed)
Medication Instructions:  Your physician recommends that you continue on your current medications as directed. Please refer to the Current Medication list given to you today.   Labwork: TODAY: BMET  Testing/Procedures: None  Follow-Up: Your physician wants you to follow-up in: 1 year with Dr. Turner. You will receive a reminder letter in the mail two months in advance. If you don't receive a letter, please call our office to schedule the follow-up appointment.   Any Other Special Instructions Will Be Listed Below (If Applicable).     If you need a refill on your cardiac medications before your next appointment, please call your pharmacy.   

## 2015-10-27 NOTE — Progress Notes (Signed)
Cardiology Office Note    Date:  10/27/2015   ID:  [name], DOB 1970-11-20, MRN [id number]  PCP:  Tawanna Solo, MD  Cardiologist:  Fransico Him, MD   Chief Complaint  Patient presents with  . Follow-up    ASD/OSA    History of Present Illness:  Charlotte Chapman is a 45 y.o. female with a history of ASD s/p repair, chronic diastolic CHF, OSA, obesity and chronic diastolic CHF who presents today for followup.  She is doing well. She denies any chest pain, SOB, DOE, dizziness, palpitations or syncope. She has chronic LE edema which is stable. She tolerates her PAP and tolerates the nasal pillow mask well and feels the pressure is adequate. She feels rested in the am for the most part if she has slept well the night before and has no daytime sleepiness. She has not been using her CPAP recently because her machine broke and she needs another one. She walks with her dog for exercise.    Past Medical History  Diagnosis Date  . Atrial septal defect     s/p repair  . Heart murmur   . OSA (obstructive sleep apnea)   . Asthma     MILD  . Social anxiety disorder   . Acne   . Chronic ear infection   . Allergic rhinitis   . Obesity   . RLS (restless legs syndrome)     Low ferritin 6/12 10/13  . Chronic diastolic CHF (congestive heart failure) ([place])   . DVT (deep venous thrombosis) [facility])     Past Surgical History  Procedure Laterality Date  . Cardiac surgery    . Tonsillectomy      Current Medications: Outpatient Prescriptions Prior to Visit  Medication Sig Dispense Refill  . acetaminophen (TYLENOL) 325 MG tablet Take 650 mg by mouth every 6 (six) hours as needed for moderate pain.    Marland Kitchen albuterol (PROAIR HFA) 108 (90 Base) MCG/ACT inhaler Inhale 2 puffs into the lungs every 4 (four) hours as needed for wheezing or shortness of breath. 3 Inhaler 0  . budesonide (PULMICORT) 180 MCG/ACT inhaler Inhale 2 puffs into the lungs daily. 1 Inhaler 5  . Calcium-Magnesium-Vitamin D  (CALCIUM MAGNESIUM PO) Take 1 tablet by mouth daily.    . cetirizine (ZYRTEC) 10 MG tablet Take 10 mg by mouth 2 (two) times daily.    . fluticasone (FLONASE) 50 MCG/ACT nasal spray Place 2 sprays into both nostrils daily.    . furosemide (LASIX) 20 MG tablet TAKE 1 TABLET BY MOUTH DAILY 90 tablet 0  . guaiFENesin (MUCINEX) 600 MG 12 hr tablet Take 600 mg by mouth 2 (two) times daily as needed.    Marland Kitchen KLOR-CON M20 20 MEQ tablet TAKE ONE TABLET BY MOUTH ONCE DAILY 30 tablet 10  . L-Lysine 1000 MG TABS Take 1,000 mg by mouth as needed.    . montelukast (SINGULAIR) 10 MG tablet Take 1 tablet (10 mg total) by mouth daily. 90 tablet 3  . Multiple Vitamin (MULTIVITAMIN) tablet Take 1 tablet by mouth daily.    Marland Kitchen rOPINIRole (REQUIP) 1 MG tablet Take 1 mg by mouth at bedtime.    . sertraline (ZOLOFT) 100 MG tablet Take 200 mg by mouth daily.    Marland Kitchen triamcinolone (NASACORT ALLERGY 24HR) 55 MCG/ACT AERO nasal inhaler Place 1 spray into the nose daily.    . mometasone (ASMANEX 60 METERED DOSES) 220 MCG/INH inhaler Inhale 2 puffs into the lungs daily. (Patient not taking:  Reported on 10/27/2015) 3 Inhaler 3   No facility-administered medications prior to visit.     Allergies:   Duraflex   Social History   Social History  . Marital Status: Single    Spouse Name: N/A  . Number of Children: N/A  . Years of Education: N/A   Social History Main Topics  . Smoking status: Never Smoker   . Smokeless tobacco: None  . Alcohol Use: 0.0 oz/week    0 Standard drinks or equivalent per week     Comment: Occasionally.  . Drug Use: No  . Sexual Activity: Not Asked   Other Topics Concern  . None   Social History Narrative     Family History:  The patient's family history includes Asthma in her sister; Autoimmune disease in her mother; CAD in her father; Cancer in her father; Cancer - Prostate in her father; Diabetes in her mother; Heart disease in her father; Hypertension in her father.   ROS:   Please see  the history of present illness.    ROS All other systems reviewed and are negative.   PHYSICAL EXAM:   VS:  BP 134/84 mmHg  Pulse 80  Ht 5' 3"  (1.6 m)  Wt 193 lb 9.6 oz (87.816 kg)  BMI 34.30 kg/m2   GEN: Well nourished, well developed, in no acute distress HEENT: normal Neck: no JVD, carotid bruits, or masses Cardiac: RRR; no murmurs, rubs, or gallops,no edema.  Intact distal pulses bilaterally.  Respiratory:  clear to auscultation bilaterally, normal work of breathing GI: soft, nontender, nondistended, + BS MS: no deformity or atrophy Skin: warm and dry, no rash Neuro:  Alert and Oriented x 3, Strength and sensation are intact Psych: euthymic mood, full affect  Wt Readings from Last 3 Encounters:  10/27/15 193 lb 9.6 oz (87.816 kg)  09/14/15 199 lb (90.266 kg)  10/26/14 194 lb 12.8 oz (88.361 kg)      Studies/Labs Reviewed:   EKG:  EKG is ordered today and showed NSR with no ST changes  Recent Labs: No results found for requested labs within last 365 days.   Lipid Panel No results found for: CHOL, TRIG, HDL, CHOLHDL, VLDL, LDLCALC, LDLDIRECT  Additional studies/ records that were reviewed today include:  none    ASSESSMENT:    1. Atrial septal defect   2. Chronic diastolic CHF (congestive heart failure) ([place])   3. Obstructive sleep apnea      PLAN:  In order of problems listed above:  1. ASD s/p repair 2. Chronic diastolic CHF - appears euvolemic on exam.  Continue lasix. OSA - the patient is tolerating PAP therapy well without any problems.  The patient has been using and benefiting from CPAP use and will continue to benefit from therapy. I will get a download from the DME. I will also get a new CPAP device.   Medication Adjustments/Labs and Tests Ordered: Current medicines are reviewed at length with the patient today.  Concerns regarding medicines are outlined above.  Medication changes, Labs and Tests ordered today are listed in the Patient  Instructions below.  There are no Patient Instructions on file for this visit.   Signed, Fransico Him, MD  10/27/2015 1:41 PM    [facility] [place], [place], [place]  [address] Phone: [phone]; Fax: [phone]

## 2015-11-02 ENCOUNTER — Encounter: Payer: Self-pay | Admitting: Cardiology

## 2015-11-02 DIAGNOSIS — G4733 Obstructive sleep apnea (adult) (pediatric): Secondary | ICD-10-CM | POA: Diagnosis not present

## 2015-12-03 DIAGNOSIS — G4733 Obstructive sleep apnea (adult) (pediatric): Secondary | ICD-10-CM | POA: Diagnosis not present

## 2015-12-22 ENCOUNTER — Other Ambulatory Visit: Payer: Self-pay | Admitting: Cardiology

## 2015-12-25 DIAGNOSIS — R21 Rash and other nonspecific skin eruption: Secondary | ICD-10-CM | POA: Diagnosis not present

## 2015-12-31 ENCOUNTER — Other Ambulatory Visit: Payer: Self-pay | Admitting: Cardiology

## 2016-01-03 DIAGNOSIS — G4733 Obstructive sleep apnea (adult) (pediatric): Secondary | ICD-10-CM | POA: Diagnosis not present

## 2016-01-04 DIAGNOSIS — Z23 Encounter for immunization: Secondary | ICD-10-CM | POA: Diagnosis not present

## 2016-01-18 DIAGNOSIS — G259 Extrapyramidal and movement disorder, unspecified: Secondary | ICD-10-CM | POA: Diagnosis not present

## 2016-01-18 DIAGNOSIS — F4011 Social phobia, generalized: Secondary | ICD-10-CM | POA: Diagnosis not present

## 2016-01-19 ENCOUNTER — Ambulatory Visit (INDEPENDENT_AMBULATORY_CARE_PROVIDER_SITE_OTHER): Payer: BLUE CROSS/BLUE SHIELD | Admitting: Cardiology

## 2016-01-19 ENCOUNTER — Encounter: Payer: Self-pay | Admitting: Cardiology

## 2016-01-19 VITALS — BP 118/84 | HR 81 | Ht 63.0 in | Wt 196.8 lb

## 2016-01-19 DIAGNOSIS — G4733 Obstructive sleep apnea (adult) (pediatric): Secondary | ICD-10-CM | POA: Diagnosis not present

## 2016-01-19 NOTE — Patient Instructions (Signed)
Medication Instructions:  Your physician recommends that you continue on your current medications as directed. Please refer to the Current Medication list given to you today.   Labwork: None  Testing/Procedures: None  Follow-Up: Your physician wants you to follow-up in: July, 2018. You will receive a reminder letter in the mail two months in advance. If you don't receive a letter, please call our office to schedule the follow-up appointment.   Any Other Special Instructions Will Be Listed Below (If Applicable).     If you need a refill on your cardiac medications before your next appointment, please call your pharmacy.

## 2016-01-19 NOTE — Progress Notes (Signed)
 Cardiology Office Note    Date:  01/19/2016   ID:  Charlotte Chapman, DOB 03/09/1971, MRN [id number]  PCP:  Charlotte LYNN, MD  Cardiologist:  Traci Turner, MD   Chief Complaint  Patient presents with  . Sleep Apnea    History of Present Illness:  Charlotte Chapman is a 45 y.o. female with OSA who presents today for10 week  followup after repeat sleep study to get a new CPAP device.  .  She is doing well.  She tolerates her PAP and tolerates the nasal pillow mask well and feels the pressure is adequate. She feels rested in the am for the most part if she has slept well the night before and has no daytime sleepiness. She likes her new CPAP device and has not had any problems with it.    Past Medical History:  Diagnosis Date  . Acne   . Allergic rhinitis   . Asthma    MILD  . Atrial septal defect    s/p repair  . Chronic diastolic CHF (congestive heart failure) ([facility])   . Chronic ear infection   . DVT (deep venous thrombosis) ([facility])   . Heart murmur   . Obesity   . OSA (obstructive sleep apnea)   . RLS (restless legs syndrome)    Low ferritin 6/12 10/13  . Social anxiety disorder     Past Surgical History:  Procedure Laterality Date  . CARDIAC SURGERY    . TONSILLECTOMY      Current Medications: Outpatient Medications Prior to Visit  Medication Sig Dispense Refill  . acetaminophen (TYLENOL) 325 MG tablet Take 650 mg by mouth every 6 (six) hours as needed for moderate pain.    . albuterol (PROAIR HFA) 108 (90 Base) MCG/ACT inhaler Inhale 2 puffs into the lungs every 4 (four) hours as needed for wheezing or shortness of breath. 3 Inhaler 0  . ASMANEX 60 METERED DOSES 220 MCG/INH inhaler Inhale 2 puffs into the lungs as needed (asthma).   2  . Calcium-Magnesium-Vitamin D (CALCIUM MAGNESIUM PO) Take 1 tablet by mouth daily.    . cetirizine (ZYRTEC) 10 MG tablet Take 10 mg by mouth 2 (two) times daily.    . fluticasone (FLONASE) 50 MCG/ACT nasal spray Place 2 sprays into both  nostrils daily.    . furosemide (LASIX) 20 MG tablet TAKE 1 TABLET BY MOUTH DAILY 90 tablet 3  . guaiFENesin (MUCINEX) 600 MG 12 hr tablet Take 600 mg by mouth 2 (two) times daily as needed.    . L-Lysine 1000 MG TABS Take 1,000 mg by mouth as needed.    . montelukast (SINGULAIR) 10 MG tablet Take 1 tablet (10 mg total) by mouth daily. 90 tablet 3  . Multiple Vitamin (MULTIVITAMIN) tablet Take 1 tablet by mouth daily.    . potassium chloride SA (KLOR-CON M20) 20 MEQ tablet Take 1 tablet (20 mEq total) by mouth daily. 210 tablet 0  . rOPINIRole (REQUIP) 1 MG tablet Take 1 mg by mouth at bedtime.    . sertraline (ZOLOFT) 100 MG tablet Take 200 mg by mouth daily.    . triamcinolone (NASACORT ALLERGY 24HR) 55 MCG/ACT AERO nasal inhaler Place 1 spray into the nose daily.    . budesonide (PULMICORT) 180 MCG/ACT inhaler Inhale 2 puffs into the lungs daily. (Patient not taking: Reported on 01/19/2016) 1 Inhaler 5   No facility-administered medications prior to visit.      Allergies:   Duraflex   Social History     Social History  . Marital status: Single    Spouse name: N/A  . Number of children: N/A  . Years of education: N/A   Social History Main Topics  . Smoking status: Never Smoker  . Smokeless tobacco: Never Used  . Alcohol use 0.0 oz/week     Comment: Occasionally.  . Drug use: No  . Sexual activity: Not Asked   Other Topics Concern  . None   Social History Narrative  . None     Family History:  The patient's family history includes Asthma in her sister; Autoimmune disease in her mother; CAD in her father; Cancer in her father; Cancer - Prostate in her father; Diabetes in her mother; Heart disease in her father; Hypertension in her father.   ROS:   Please see the history of present illness.    ROS All other systems reviewed and are negative.  No flowsheet data found.     PHYSICAL EXAM:   VS:  BP 118/84   Pulse 81   Ht 5' 3" (1.6 m)   Wt 196 lb 12.8 oz (89.3 kg)    SpO2 94%   BMI 34.86 kg/m    GEN: Well nourished, well developed, in no acute distress  HEENT: normal  Neck: no JVD, carotid bruits, or masses Cardiac: RRR; no murmurs, rubs, or gallops,no edema.  Intact distal pulses bilaterally.  Respiratory:  clear to auscultation bilaterally, normal work of breathing GI: soft, nontender, nondistended, + BS MS: no deformity or atrophy  Skin: warm and dry, no rash Neuro:  Alert and Oriented x 3, Strength and sensation are intact Psych: euthymic mood, full affect  Wt Readings from Last 3 Encounters:  01/19/16 196 lb 12.8 oz (89.3 kg)  10/27/15 193 lb 9.6 oz (87.8 kg)  09/14/15 199 lb (90.3 kg)      Studies/Labs Reviewed:   EKG:  EKG is not ordered today.   Recent Labs: 10/27/2015: BUN 13; Creat 0.82; Potassium 4.4; Sodium 139   Lipid Panel No results found for: CHOL, TRIG, HDL, CHOLHDL, VLDL, LDLCALC, LDLDIRECT  Additional studies/ records that were reviewed today include:  CPAP download    ASSESSMENT:    1. Obstructive sleep apnea      PLAN:  In order of problems listed above:  OSA - the patient is tolerating PAP therapy well without any problems. The PAP download was reviewed today and showed an AHI of 0.5/hr on 12 cm H2O with 100% compliance in using more than 4 hours nightly.  The patient has been using and benefiting from CPAP use and will continue to benefit from therapy.      Medication Adjustments/Labs and Tests Ordered: Current medicines are reviewed at length with the patient today.  Concerns regarding medicines are outlined above.  Medication changes, Labs and Tests ordered today are listed in the Patient Instructions below.  There are no Patient Instructions on file for this visit.   Signed, Traci Turner, MD  01/19/2016 3:56 PM    [facility] [address], [place],   [address] Phone: [phone]; Fax: [phone]   

## 2016-01-20 DIAGNOSIS — E669 Obesity, unspecified: Secondary | ICD-10-CM | POA: Diagnosis not present

## 2016-02-02 DIAGNOSIS — G4733 Obstructive sleep apnea (adult) (pediatric): Secondary | ICD-10-CM | POA: Diagnosis not present

## 2016-03-04 DIAGNOSIS — G4733 Obstructive sleep apnea (adult) (pediatric): Secondary | ICD-10-CM | POA: Diagnosis not present

## 2016-04-19 ENCOUNTER — Other Ambulatory Visit: Payer: Self-pay | Admitting: Allergy and Immunology

## 2016-04-19 MED ORDER — MONTELUKAST SODIUM 10 MG PO TABS: 10.0000 mg | ORAL_TABLET | Freq: Every day | ORAL | 0 refills | Status: DC

## 2016-04-19 NOTE — Telephone Encounter (Signed)
Patient called requesting a refill for Montelukast. I looked in her chart and she was last seen by Dr. Lucie LeatherKozlow on 09-14-15. I told her she needed to make an appt, which she did for 1-10 with Dr. Dellis AnesGallagher. She only has two Montelukast left and would like to know if a nurse could call her in enough to get her through to [facility]-10[facility]

## 2016-04-19 NOTE — Telephone Encounter (Signed)
Patient advised that she must keep appointment on 04-26-16 for any further refills. I have sent a 30 day supply in to pharmacy.

## 2016-04-26 ENCOUNTER — Ambulatory Visit (INDEPENDENT_AMBULATORY_CARE_PROVIDER_SITE_OTHER): Payer: BLUE CROSS/BLUE SHIELD | Admitting: Allergy & Immunology

## 2016-04-26 VITALS — HR 82 | Temp 97.8°F | Resp 28

## 2016-04-26 DIAGNOSIS — J3089 Other allergic rhinitis: Secondary | ICD-10-CM | POA: Diagnosis not present

## 2016-04-26 DIAGNOSIS — J4530 Mild persistent asthma, uncomplicated: Secondary | ICD-10-CM | POA: Diagnosis not present

## 2016-04-26 NOTE — Patient Instructions (Addendum)
1. Perennial allergic rhinitis - Continue with alternating Nasonex and Flonase. - Continue with Zyrtec 10mg  daily. - Continue with Singulair 10mg  daily.  2. Acute sinusitis - Start the prednisone provided in clinic today. - Continue with nasal saline rinses 1-2 times daily. - Start Mucinex. - Call us in two days if you are not feeling better.   3. Mild persistent asthma, uncomplicated - Lung function stable. - No changes to medications. - Daily controller medication(s): Asmanex two puffs daily - Rescue medications: ProAir 4 puffs every 4-6 hours as needed - Changes during respiratory infections or worsening symptoms: increase Asmanex to 2 puffs twice daily for TWO WEEKS. - Asthma control goals:  * Full participation in all desired activities (may need albuterol before activity) * Albuterol use two time or less a week on average (not counting use with activity) * Cough interfering with sleep two time or less a month * Oral steroids no more than once a year * No hospitalizations  4. Return in about 1 year (around 04/26/2017).  Please inform us of any Emergency Department visits, hospitalizations, or changes in symptoms. Call us before going to the ED for breathing or allergy symptoms since we might be able to fit you in for a sick visit. Feel free to contact us anytime with any questions, problems, or concerns.  It was a pleasure to meet you today! Best wishes in the [place] Year!   Websites that have reliable patient information: 1. American Academy of Asthma, Allergy, and Immunology: www.aaaai.org 2. Food Allergy Research and Education (FARE): foodallergy.org 3. Mothers of Asthmatics: http://www.asthmacommunitynetwork.org 4. American College of Allergy, Asthma, and Immunology: www.acaai.org

## 2016-04-26 NOTE — Progress Notes (Signed)
FOLLOW UP  Date of Service/Encounter:  04/26/16   Assessment:   Mild persistent asthma, uncomplicated - with fixed mild restrictive disease on spirometry  Perennial allergic rhinitis   Asthma Reportables:  Severity: mild persistent  Risk: low Control: well controlled  Seasonal Influenza Vaccine: yes    Plan/Recommendations:   1. Perennial allergic rhinitis - Continue with alternating Nasonex and Flonase. - There is no evidence for the above regimen, but this is what Charlotte Chapman has always done.  - Continue with Zyrtec 64m daily. - Continue with Singulair 136mdaily.  2. Acute sinusitis - Start the prednisone (107mID x 4 days) provided in clinic today. - Continue with nasal saline rinses 1-2 times daily. - Continue with Mucinex.  - Call us [place] two days if you are not feeling better and we can send in antibiotics at this time. - [name] that she is improving overall.   3. Mild persistent asthma, uncomplicated - Lung function stable. - No changes to medications. - Daily controller medication(s): Asmanex two puffs daily - Rescue medications: ProAir 4 puffs every 4-6 hours as needed - Changes during respiratory infections or worsening symptoms: increase Asmanex to 2 puffs twice daily for TWO WEEKS. - Asthma control goals:  * Full participation in all desired activities (may need albuterol before activity) * Albuterol use two time or less a week on average (not counting use with activity) * Cough interfering with sleep two time or less a month * Oral steroids no more than once a year * No hospitalizations  4. Return in about 1 year (around 04/26/2017).   Subjective:   Charlotte Chapman a 45 70o. female presenting today for follow up of  Chief Complaint  Patient presents with  . Follow-up  . Sinus Problem    Charlotte Chapman has a history of the following: Patient Active Problem List   Diagnosis Date Noted  . Chronic diastolic CHF (congestive  heart failure) ([place] . Obstructive sleep apnea 04/11/2013  . Asthma   . Atrial septal defect     History obtained from: chart review and patient.  Charlotte Chapman was referred by Charlotte Chapman.     Charlotte Chapman is a 45 15o. female presenting for a follow up visit. She was last seen in May 2017 at which time she was doing very well on her regimen without recent exacerbations. She has a history of fixed depressed spirometric readings from an unknown etiology. However she has never had any symptoms and rarely used her bronchodilator. Therefore this has not been worked up.   Since the last visit, she has generally done well. However, a few days ago she developed some shortness of breath and is also having sinus pressure. She is currently on a nasal spray (vascillates between Flonase and Nasonex). She does use nasal saline as well. She mostly has frontal sinus tenderness. She is on Zytec-D on a daily basis for the past several years. She typically gets sinus infections 1-2 times per year. Her last infection was around October 2017.   Ms. KuhEspeyes have a history of asthma and is on Singulair, Asmanex, and ProAir . She is on the Twisthaler and uses two puffs once per day. Prior to this, she was on Pulmicort but insurance coverage changed her to Asmanex instead. She estimates that she needs prednisone around once per year at the most.   In November 2017 she developed leg pain and difficulty walking. She was diagnosed with a  DVT and was treated with coumadin for several weeks. She does have sleep apnea and uses her CPAP as directed. Otherwise, there have been no changes to her past medical history, surgical history, family history, or social history.     Review of Systems: a 14-point review of systems is pertinent for what is mentioned in HPI.  Otherwise, all other systems were negative. Constitutional: negative other than that listed in the HPI Eyes: negative other than that listed in the  HPI Ears, nose, mouth, throat, and face: negative other than that listed in the HPI Respiratory: negative other than that listed in the HPI Cardiovascular: negative other than that listed in the HPI Gastrointestinal: negative other than that listed in the HPI Genitourinary: negative other than that listed in the HPI Integument: negative other than that listed in the HPI Hematologic: negative other than that listed in the HPI Musculoskeletal: negative other than that listed in the HPI Neurological: negative other than that listed in the HPI Allergy/Immunologic: negative other than that listed in the HPI    Objective:   Pulse 82, temperature 97.8 F (36.6 C), temperature source Oral, resp. rate (!) 28. There is no height or weight on file to calculate BMI.   Physical Exam:  General: Alert, interactive, in no acute distress. Pleasant. Not extremely interactive.  Eyes: No conjunctival injection present on the right, No conjunctival injection present on the left, PERRL bilaterally, No discharge on the right, No discharge on the left and No Horner-Trantas dots present Ears: Right TM pearly gray with normal light reflex, Left TM pearly gray with normal light reflex, Right TM intact without perforation and Left TM intact without perforation.  Nose/Throat: External nose within normal limits and septum midline, turbinates markedly edematous with clear discharge, post-pharynx erythematous without cobblestoning in the posterior oropharynx. Tonsils 2+ without exudates Neck: Supple without thyromegaly. Lungs: Decreased breath sounds bilaterally without wheezing, rhonchi or rales. No increased work of breathing. CV: Normal S1/S2, no murmurs. Capillary refill <2 seconds.  Skin: Warm and dry, without lesions or rashes. Neuro:   Grossly intact. No focal deficits appreciated. Responsive to questions.   Diagnostic studies:  Spirometry: results abnormal (FEV1: 1.80/68%, FVC: 2.10/66%, FEV1/FVC: 85%).     Spirometry consistent with possible restrictive disease. Numbers are stable compared to previous spirometry.  Allergy Studies: None    Charlotte Marvel, MD [facility]

## 2016-05-13 ENCOUNTER — Other Ambulatory Visit: Payer: Self-pay | Admitting: Allergy & Immunology

## 2016-05-15 ENCOUNTER — Other Ambulatory Visit: Payer: Self-pay | Admitting: Allergy & Immunology

## 2016-05-15 DIAGNOSIS — G4733 Obstructive sleep apnea (adult) (pediatric): Secondary | ICD-10-CM | POA: Diagnosis not present

## 2016-05-15 MED ORDER — ASMANEX (60 METERED DOSES) 220 MCG/INH IN AEPB: 2.0000 | INHALATION_SPRAY | Freq: Every day | RESPIRATORY_TRACT | 5 refills | Status: DC

## 2016-05-15 NOTE — Telephone Encounter (Signed)
Patient was seen by Dr. Dellis AnesGallagher 04-26-16. She would like a refill on her [facility]manex. [facility]

## 2016-05-15 NOTE — Telephone Encounter (Signed)
Called patient and informed her that we sent the refill for her Asmanex [facility] [address].

## 2016-06-14 DIAGNOSIS — G4733 Obstructive sleep apnea (adult) (pediatric): Secondary | ICD-10-CM | POA: Diagnosis not present

## 2016-07-30 ENCOUNTER — Other Ambulatory Visit: Payer: Self-pay | Admitting: Cardiology

## 2016-07-31 ENCOUNTER — Other Ambulatory Visit: Payer: Self-pay | Admitting: *Deleted

## 2016-07-31 MED ORDER — POTASSIUM CHLORIDE CRYS ER 20 MEQ PO TBCR: 20.0000 meq | EXTENDED_RELEASE_TABLET | Freq: Every day | ORAL | 1 refills | Status: DC

## 2016-09-06 ENCOUNTER — Other Ambulatory Visit: Payer: Self-pay | Admitting: Allergy & Immunology

## 2016-11-16 DIAGNOSIS — J019 Acute sinusitis, unspecified: Secondary | ICD-10-CM | POA: Diagnosis not present

## 2016-11-20 DIAGNOSIS — J019 Acute sinusitis, unspecified: Secondary | ICD-10-CM | POA: Diagnosis not present

## 2016-11-20 DIAGNOSIS — J4531 Mild persistent asthma with (acute) exacerbation: Secondary | ICD-10-CM | POA: Diagnosis not present

## 2016-11-29 ENCOUNTER — Other Ambulatory Visit: Payer: Self-pay | Admitting: Allergy & Immunology

## 2016-12-09 ENCOUNTER — Other Ambulatory Visit: Payer: Self-pay | Admitting: Cardiology

## 2016-12-26 ENCOUNTER — Encounter: Payer: Self-pay | Admitting: Cardiology

## 2017-01-01 ENCOUNTER — Encounter: Payer: Self-pay | Admitting: Cardiology

## 2017-01-01 ENCOUNTER — Ambulatory Visit (INDEPENDENT_AMBULATORY_CARE_PROVIDER_SITE_OTHER): Payer: BLUE CROSS/BLUE SHIELD | Admitting: Cardiology

## 2017-01-01 VITALS — BP 118/82 | HR 56 | Ht 63.0 in | Wt 187.4 lb

## 2017-01-01 DIAGNOSIS — I5032 Chronic diastolic (congestive) heart failure: Secondary | ICD-10-CM | POA: Diagnosis not present

## 2017-01-01 DIAGNOSIS — Q211 Atrial septal defect, unspecified: Secondary | ICD-10-CM

## 2017-01-01 DIAGNOSIS — G4733 Obstructive sleep apnea (adult) (pediatric): Secondary | ICD-10-CM

## 2017-01-01 NOTE — Patient Instructions (Signed)
Medication Instructions:  Your physician recommends that you continue on your current medications as directed. Please refer to the Current Medication list given to you today.   Labwork: TODAY - basic metabolic panel   Testing/Procedures: Your physician has requested that you have an echocardiogram. Echocardiography is a painless test that uses sound waves to create images of your heart. It provides your doctor with information about the size and shape of your heart and how well your heart's chambers and valves are working. This procedure takes approximately one hour. There are no restrictions for this procedure.   Follow-Up: Your physician wants you to follow-up in: 1 year with Dr. Mayford Knife.  You will receive a reminder letter in the mail two months in advance. If you don't receive a letter, please call our office to schedule the follow-up appointment.   If you need a refill on your cardiac medications before your next appointment, please call your pharmacy.   Thank you for choosing [facility]! Eligha Bridegroom, RN [phone]

## 2017-01-01 NOTE — Progress Notes (Signed)
Cardiology Office Note:    Date:  01/01/2017   ID:  Charlotte Chapman, DOB 09/22/1970, MRN [id number]  PCP:  Kathyrn Lass, MD  Cardiologist:  Fransico Him, MD   Referring MD: Kathyrn Lass, MD   Chief Complaint  Patient presents with  . Congestive Heart Failure  . Sleep Apnea    History of Present Illness:    Charlotte Chapman is a 46 y.o. female with a hx of ASD s/p repair, chronic diastolic CHF and OSA on CPAP.  She is here today for followup and is doing well.  She denies any chest pain or pressure, SOB, DOE, PND, orthopnea, dizziness, palpitations or syncope.  She has chronic LE edema when standing for long periods of time that is unchanged.  She is doing well with her CPAP device.  She tolerates her device and feels the pressure is good.  She says that her mask leaks some because her head is shaped different and sometime has a hard time with the seal.  She has not had any problems with her mask.  She continues to feel rested in the am for the most part with no significant daytime sleepiness.  She does not think that she snores.  She denies any mouth or nasal dryness.   Past Medical History:  Diagnosis Date  . Acne   . Allergic rhinitis   . Asthma    MILD  . Atrial septal defect    s/p repair  . Chronic diastolic CHF (congestive heart failure) ([place])   . Chronic ear infection   . DVT (deep venous thrombosis) ([place])   . Heart murmur   . Obesity   . OSA (obstructive sleep apnea)   . RLS (restless legs syndrome)    Low ferritin 6/12 10/13  . Social anxiety disorder     Past Surgical History:  Procedure Laterality Date  . CARDIAC SURGERY    . TONSILLECTOMY      Current Medications: Current Meds  Medication Sig  . acetaminophen (TYLENOL) 325 MG tablet Take 650 mg by mouth every 6 (six) hours as needed for moderate pain.  Marland Kitchen albuterol (PROAIR HFA) 108 (90 Base) MCG/ACT inhaler Inhale 2 puffs into the lungs every 4 (four) hours as needed for wheezing or shortness of breath.  .  ASMANEX 60 METERED DOSES 220 MCG/INH inhaler INHALE 2 PUFFS INTO THE LUNGS DAILY  . Calcium-Magnesium-Vitamin D (CALCIUM MAGNESIUM PO) Take 1 tablet by mouth daily.  . cetirizine (ZYRTEC) 10 MG tablet Take 10 mg by mouth 2 (two) times daily.  . fluticasone (FLONASE) 50 MCG/ACT nasal spray Place 2 sprays into both nostrils daily.   . furosemide (LASIX) 20 MG tablet TAKE 1 TABLET BY MOUTH DAILY  . guaiFENesin (MUCINEX) 600 MG 12 hr tablet Take 600 mg by mouth 2 (two) times daily as needed.  Marland Kitchen L-Lysine 1000 MG TABS Take 1,000 mg by mouth as needed.  . montelukast (SINGULAIR) 10 MG tablet TAKE 1 TABLET (10 MG TOTAL) BY MOUTH DAILY.  . Multiple Vitamin (MULTIVITAMIN) tablet Take 1 tablet by mouth daily.  . potassium chloride SA (KLOR-CON M20) 20 MEQ tablet Take 1 tablet (20 mEq total) by mouth daily.  Marland Kitchen rOPINIRole (REQUIP) 1 MG tablet Take 1 mg by mouth at bedtime.  . sertraline (ZOLOFT) 100 MG tablet Take 200 mg by mouth daily.     Allergies:   Duraflex   Social History   Social History  . Marital status: Single    Spouse name: N/A  .  Number of children: N/A  . Years of education: N/A   Social History Main Topics  . Smoking status: Never Smoker  . Smokeless tobacco: Never Used  . Alcohol use 0.0 oz/week     Comment: Occasionally.  . Drug use: No  . Sexual activity: Not Asked   Other Topics Concern  . None   Social History Narrative  . None     Family History: The patient's family history includes Asthma in her sister; Autoimmune disease in her mother; CAD in her father; Cancer in her father; Cancer - Prostate in her father; Diabetes in her mother; Heart disease in her father; Hypertension in her father.  ROS:   Please see the history of present illness.     All other systems reviewed and are negative.  EKGs/Labs/Other Studies Reviewed:    The following studies were reviewed today: CPAP downlaod  EKG:  EKG is not ordered today.    Recent Labs: No results found for  requested labs within last 8760 hours.   Recent Lipid Panel No results found for: CHOL, TRIG, HDL, CHOLHDL, VLDL, LDLCALC, LDLDIRECT  Physical Exam:    VS:  BP 118/82   Pulse (!) 56   Ht _0  (1.6 m)     Wt Readings from Last 3 Encounters:  01/19/16 196 lb 12.8 oz (89.3 kg)  10/27/15 193 lb 9.6 oz (87.8 kg)  09/14/15 199 lb (90.3 kg)     GEN:  Well nourished, well developed in no acute distress HEENT: Normal NECK: No JVD; No carotid bruits LYMPHATICS: No lymphadenopathy CARDIAC: RRR, no murmurs, rubs, gallops RESPIRATORY:  Clear to auscultation without rales, wheezing or rhonchi  ABDOMEN: Soft, non-tender, non-distended MUSCULOSKELETAL:  No edema; No deformity  SKIN: Warm and dry NEUROLOGIC:  Alert and oriented x 3 PSYCHIATRIC:  Normal affect   ASSESSMENT:    1. Chronic diastolic CHF (congestive heart failure) ([place])   2. Atrial septal defect   3. Obstructive sleep apnea    PLAN:    In order of problems listed above:  1.  Chronic diastolic CHF - she appears euvolemic on exam today.  Her weight is stable. She will continue lasix 26m daily.  I will check a BMET today.    2.  ASD s/p repair- she has not had an echo in 4 years so I will repeat this to make sure her ASD repair is intact.   3.  OSA - the patient is tolerating PAP therapy well without any problems. The PAP download was reviewed today and showed an AHI of 0.4/hr on 12 cm H2O with 100% compliance in using more than 4 hours nightly.  The patient has been using and benefiting from CPAP use and will continue to benefit from therapy.     Medication Adjustments/Labs and Tests Ordered: Current medicines are reviewed at length with the patient today.  Concerns regarding medicines are outlined above.  No orders of the defined types were placed in this encounter.  No orders of the defined types were placed in this encounter.   Signed, TFransico Him MD  01/01/2017 2:51 PM    [facility]

## 2017-01-02 LAB — BASIC METABOLIC PANEL
BUN / CREAT RATIO: 13 (ref 9–23)
BUN: 10 mg/dL (ref 6–24)
CO2: 20 mmol/L (ref 20–29)
CREATININE: 0.77 mg/dL (ref 0.57–1.00)
Calcium: 10.1 mg/dL (ref 8.7–10.2)
Chloride: 107 mmol/L — ABNORMAL HIGH (ref 96–106)
GFR calc Af Amer: 108 mL/min/{1.73_m2} (ref >59)
GFR, EST NON AFRICAN AMERICAN: 94 mL/min/{1.73_m2} (ref >59)
Glucose: 97 mg/dL (ref 65–99)
Potassium: 4.5 mmol/L (ref 3.5–5.2)
SODIUM: 144 mmol/L (ref 134–144)

## 2017-01-03 DIAGNOSIS — G4733 Obstructive sleep apnea (adult) (pediatric): Secondary | ICD-10-CM | POA: Diagnosis not present

## 2017-01-05 ENCOUNTER — Ambulatory Visit (HOSPITAL_COMMUNITY): Payer: BLUE CROSS/BLUE SHIELD | Attending: Cardiology

## 2017-01-05 ENCOUNTER — Other Ambulatory Visit: Payer: Self-pay

## 2017-01-05 DIAGNOSIS — Z8774 Personal history of (corrected) congenital malformations of heart and circulatory system: Secondary | ICD-10-CM | POA: Insufficient documentation

## 2017-01-05 DIAGNOSIS — Q211 Atrial septal defect, unspecified: Secondary | ICD-10-CM

## 2017-01-05 DIAGNOSIS — E669 Obesity, unspecified: Secondary | ICD-10-CM | POA: Insufficient documentation

## 2017-01-05 DIAGNOSIS — G4730 Sleep apnea, unspecified: Secondary | ICD-10-CM | POA: Insufficient documentation

## 2017-01-05 DIAGNOSIS — I509 Heart failure, unspecified: Secondary | ICD-10-CM | POA: Diagnosis not present

## 2017-01-05 DIAGNOSIS — Z6833 Body mass index (BMI) 33.0-33.9, adult: Secondary | ICD-10-CM | POA: Insufficient documentation

## 2017-01-09 ENCOUNTER — Telehealth: Payer: Self-pay | Admitting: *Deleted

## 2017-01-09 DIAGNOSIS — Q211 Atrial septal defect, unspecified: Secondary | ICD-10-CM

## 2017-01-09 DIAGNOSIS — I5032 Chronic diastolic (congestive) heart failure: Secondary | ICD-10-CM

## 2017-01-09 NOTE — Telephone Encounter (Signed)
Notes recorded by Quintella Reichert, MD on 01/08/2017 at 2:59 PM EDT Echo showed mild LV dysfunction but poor quality study. Stable ASD repair - please get limited study with definity to define wall motion better

## 2017-01-10 ENCOUNTER — Telehealth (HOSPITAL_COMMUNITY): Payer: Self-pay | Admitting: Cardiology

## 2017-01-11 NOTE — Telephone Encounter (Signed)
User: Charlotte Chapman A Date/time: 01/10/17 9:21 AM  Comment: Called pt and lmsg for her to CB to sch echo.   Context:  Outcome: Left Message  Phone number: [phone] Phone Type: Home Phone  Comm. type: Telephone Call type: Outgoing  Contact: Lusher, Deni Relation to patient: Self

## 2017-01-14 ENCOUNTER — Other Ambulatory Visit: Payer: Self-pay | Admitting: Cardiology

## 2017-01-16 ENCOUNTER — Other Ambulatory Visit: Payer: Self-pay | Admitting: *Deleted

## 2017-01-16 ENCOUNTER — Other Ambulatory Visit: Payer: Self-pay | Admitting: Cardiology

## 2017-01-16 MED ORDER — POTASSIUM CHLORIDE CRYS ER 20 MEQ PO TBCR: 20.0000 meq | EXTENDED_RELEASE_TABLET | Freq: Every day | ORAL | 3 refills | Status: DC

## 2017-01-17 ENCOUNTER — Other Ambulatory Visit: Payer: Self-pay

## 2017-01-17 ENCOUNTER — Ambulatory Visit (HOSPITAL_COMMUNITY): Payer: BLUE CROSS/BLUE SHIELD | Attending: Cardiology

## 2017-01-17 DIAGNOSIS — Q211 Atrial septal defect, unspecified: Secondary | ICD-10-CM

## 2017-01-17 DIAGNOSIS — I5032 Chronic diastolic (congestive) heart failure: Secondary | ICD-10-CM | POA: Diagnosis not present

## 2017-01-17 DIAGNOSIS — I071 Rheumatic tricuspid insufficiency: Secondary | ICD-10-CM | POA: Diagnosis not present

## 2017-01-17 MED ORDER — PERFLUTREN LIPID MICROSPHERE
1.0000 mL | INTRAVENOUS | Status: AC | PRN
  Administered 2017-01-17: 2 mL via INTRAVENOUS

## 2017-01-18 NOTE — Telephone Encounter (Signed)
Medication Detail    Disp Refills Start End   potassium chloride SA (KLOR-CON M20) 20 MEQ tablet 90 tablet 3 01/16/2017    Sig - Route: Take 1 tablet (20 mEq total) by mouth daily. - Oral   Sent to pharmacy as: potassium chloride SA (KLOR-CON M20) 20 MEQ tablet   E-Prescribing Status: Receipt confirmed by pharmacy (01/16/2017 10:43 AM EDT)   Pharmacy   [facility] - [place], [place] - [address]

## 2017-01-25 ENCOUNTER — Other Ambulatory Visit: Payer: Self-pay | Admitting: Cardiology

## 2017-01-25 MED ORDER — POTASSIUM CHLORIDE CRYS ER 20 MEQ PO TBCR: 20.0000 meq | EXTENDED_RELEASE_TABLET | Freq: Every day | ORAL | 3 refills | Status: DC

## 2017-01-25 NOTE — Telephone Encounter (Signed)
Medication Detail    Disp Refills Start End   potassium chloride SA (KLOR-CON M20) 20 MEQ tablet 90 tablet 3 01/16/2017    Sig - Route: Take 1 tablet (20 mEq total) by mouth daily. - Oral   Sent to pharmacy as: potassium chloride SA (KLOR-CON M20) 20 MEQ tablet   E-Prescribing Status: Receipt confirmed by pharmacy (01/16/2017 10:43 AM EDT)

## 2017-01-25 NOTE — Addendum Note (Signed)
Addended by: Weber Cooks on: 01/25/2017 03:25 PM   Modules accepted: Orders

## 2017-02-15 DIAGNOSIS — F4011 Social phobia, generalized: Secondary | ICD-10-CM | POA: Diagnosis not present

## 2017-02-15 DIAGNOSIS — F411 Generalized anxiety disorder: Secondary | ICD-10-CM | POA: Diagnosis not present

## 2017-02-15 DIAGNOSIS — J4530 Mild persistent asthma, uncomplicated: Secondary | ICD-10-CM | POA: Diagnosis not present

## 2017-02-15 DIAGNOSIS — N920 Excessive and frequent menstruation with regular cycle: Secondary | ICD-10-CM | POA: Diagnosis not present

## 2017-02-15 DIAGNOSIS — Z86718 Personal history of other venous thrombosis and embolism: Secondary | ICD-10-CM | POA: Diagnosis not present

## 2017-03-07 ENCOUNTER — Other Ambulatory Visit: Payer: Self-pay | Admitting: Cardiology

## 2017-03-12 ENCOUNTER — Other Ambulatory Visit: Payer: Self-pay | Admitting: Allergy & Immunology

## 2017-04-26 DIAGNOSIS — G2581 Restless legs syndrome: Secondary | ICD-10-CM | POA: Diagnosis not present

## 2017-05-14 ENCOUNTER — Telehealth: Payer: Self-pay | Admitting: Allergy and Immunology

## 2017-05-14 NOTE — Telephone Encounter (Signed)
Called and spoke with patient and informed her that she hasn't been seen in over a year and would need an office visit to discuss this medication management She has made an office visit Thursday with Dr. Dellis AnesGallagher..Marland Kitchen

## 2017-05-14 NOTE — Telephone Encounter (Signed)
Pt came in to see if we could change her inhalers because they are too high. [facility] [address] [phone].

## 2017-06-14 ENCOUNTER — Encounter: Payer: Self-pay | Admitting: Allergy & Immunology

## 2017-06-14 ENCOUNTER — Ambulatory Visit: Payer: BLUE CROSS/BLUE SHIELD | Admitting: Allergy & Immunology

## 2017-06-14 VITALS — BP 120/72 | HR 68 | Resp 17 | Ht 62.5 in | Wt 181.8 lb

## 2017-06-14 DIAGNOSIS — J3089 Other allergic rhinitis: Secondary | ICD-10-CM | POA: Diagnosis not present

## 2017-06-14 DIAGNOSIS — J4530 Mild persistent asthma, uncomplicated: Secondary | ICD-10-CM

## 2017-06-14 MED ORDER — BECLOMETHASONE DIPROP HFA 80 MCG/ACT IN AERB: 2.0000 | INHALATION_SPRAY | Freq: Every day | RESPIRATORY_TRACT | 5 refills | Status: DC

## 2017-06-14 MED ORDER — ALBUTEROL SULFATE HFA 108 (90 BASE) MCG/ACT IN AERS: 2.0000 | INHALATION_SPRAY | RESPIRATORY_TRACT | 0 refills | Status: DC | PRN

## 2017-06-14 NOTE — Patient Instructions (Addendum)
1. Perennial allergic rhinitis - Continue with alternating Nasonex and Flonase. - Continue with Zyrtec 10mg  daily. - Continue with Singulair 10mg  daily.  2. Mild persistent asthma, uncomplicated - Lung function stable. - We will fight with your insurance company to get something approved.  - Daily controller medication(s): Qvar 80mcg two puffs daily - Rescue medications: ProAir 4 puffs every 4-6 hours as needed - Changes during respiratory infections or worsening symptoms: increase Qvar 80mcg to 2 puffs twice daily for TWO WEEKS. - Asthma control goals:  * Full participation in all desired activities (may need albuterol before activity) * Albuterol use two time or less a week on average (not counting use with activity) * Cough interfering with sleep two time or less a month * Oral steroids no more than once a year * No hospitalizations  4. Return in about 6 months (around 12/12/2017).  Please inform us of any Emergency Department visits, hospitalizations, or changes in symptoms. Call us before going to the ED for breathing or allergy symptoms since we might be able to fit you in for a sick visit. Feel free to contact us anytime with any questions, problems, or concerns.  It was a pleasure to meet you today!   Websites that have reliable patient information: 1. American Academy of Asthma, Allergy, and Immunology: www.aaaai.org 2. Food Allergy Research and Education (FARE): foodallergy.org 3. Mothers of Asthmatics: http://www.asthmacommunitynetwork.org 4. American College of Allergy, Asthma, and Immunology: www.acaai.org

## 2017-06-14 NOTE — Progress Notes (Signed)
FOLLOW UP  Date of Service/Encounter:  06/14/17   Assessment:   Perennial allergic rhinitis  Mild persistent asthma, uncomplicated  Plan/Recommendations:   1. Perennial allergic rhinitis - Continue with alternating Nasonex and Flonase. - Continue with Zyrtec 2m daily. - Continue with Singulair 139mdaily.  2. Mild persistent asthma, uncomplicated - Lung function stable. - We will fight with your insurance company to get something approved.  - Qvar copay card approved.  - Daily controller medication(s): Qvar 8014mtwo puffs daily - Rescue medications: ProAir 4 puffs every 4-6 hours as needed - Changes during respiratory infections or worsening symptoms: increase Qvar 31m41mo 2 puffs twice daily for TWO WEEKS. - Asthma control goals:  * Full participation in all desired activities (may need albuterol before activity) * Albuterol use two time or less a week on average (not counting use with activity) * Cough interfering with sleep two time or less a month * Oral steroids no more than once a year * No hospitalizations  4. Return in about 6 months (around 12/12/2017).  Subjective:   Charlotte Chapman 46 y67. female presenting today for follow up of  Chief Complaint  Patient presents with  . Asthma    Charlotte Chapman a history of the following: Patient Active Problem List   Diagnosis Date Noted  . Chronic diastolic CHF (congestive heart failure) (HCC)[place]. Obstructive sleep apnea 04/11/2013  . Asthma   . Atrial septal defect     History obtained from: chart review and patient.  Charlotte Chapman's Primary Care Provider is Charlotte Chapman.     Charlotte Chapman is a 46 y75. female presenting for a follow up visit. She was last seen in January 2018. At that time, she was going well on Asmanex two puffs daily as well as ProAir as needed. We did diagnose her with a sinus infection and started her on prednisone and recommended Mucinex. We recommended managing her allergic  rhinitis with Nasonex rotated with Flonase, which seemed to work well for her. We also recommended continuing her on Zyrtec 10mg58mly and Singulair 10mg 57my.   Asthma/Respiratory Symptom History: Since the last visit, she has mostly done well. She remains on the Asmanex, which she last took around one month ago. She was on two puffs once daily. However, her copay for this medication is $200. She did get new insurance since the last visit. Ophia's asthma has been well controlled. She has not required rescue medication, experienced nocturnal awakenings due to lower respiratory symptoms, nor have activities of daily living been limited. She has required no Emergency Department or Urgent Care visits for her asthma. She has required zero courses of systemic steroids for asthma exacerbations since the last visit. ACT score today is 23, indicating excellent asthma symptom control. She did have prednisone last time that I saw her, but she has not had any since that time. Interestingly, she does have chest tightness that worsens for three days during her period.   Allergic Rhinitis Symptom History: She remains on the alternating nasal steroids. She is also on Singulair and Zyrtec. She has had no sinus infections since the last time that we saw her. Spring and fall are the worse times of the year for her.   She does still use her CPAP. She has had no additional blood clots. Otherwise, there have been no changes to her past medical history, surgical history, family history, or social history. Currently she is working part time at  [employer] and then part time at [employer].     Review of Systems: a 14-point review of systems is pertinent for what is mentioned in HPI.  Otherwise, all other systems were negative. Constitutional: negative other than that listed in the HPI Eyes: negative other than that listed in the HPI Ears, nose, mouth, throat, and face: negative other than that listed in the  HPI Respiratory: negative other than that listed in the HPI Cardiovascular: negative other than that listed in the HPI Gastrointestinal: negative other than that listed in the HPI Genitourinary: negative other than that listed in the HPI Integument: negative other than that listed in the HPI Hematologic: negative other than that listed in the HPI Musculoskeletal: negative other than that listed in the HPI Neurological: negative other than that listed in the HPI Allergy/Immunologic: negative other than that listed in the HPI    Objective:   Blood pressure 120/72, pulse 68, resp. rate 17, height 5' 2.5" (1.588 m), weight 181 lb 12.8 oz (82.5 kg), SpO2 96 %. Body mass index is 32.72 kg/m.   Physical Exam:  General: Alert, interactive, in no acute distress. Pleasant female.  Eyes: Dark eye shadow bilaterally, No conjunctival injection bilaterally, no discharge on the right and no discharge on the left. PERRL bilaterally. EOMI without pain. No photophobia.  Ears: Right TM pearly gray with normal light reflex, Left TM pearly gray with normal light reflex, Right TM intact without perforation and Left TM intact without perforation.  Nose/Throat: External nose within normal limits and septum midline. Turbinates edematous and pale with clear discharge. Posterior oropharynx erythematous without cobblestoning in the posterior oropharynx. Tonsils 2+ without exudates.  Tongue without thrush. Lungs: Clear to auscultation without wheezing, rhonchi or rales. No increased work of breathing. CV: Normal S1/S2. No murmurs. Capillary refill <2 seconds.  Skin: Warm and dry, without lesions or rashes. Neuro:   Grossly intact. No focal deficits appreciated. Responsive to questions.  Diagnostic studies:   Spirometry: results abnormal (FEV1: 1.61/57%, FVC: 1.99/57%, FEV1/FVC: 81%).    Spirometry consistent with possible restrictive disease. Overall these are comparable to previous visits. We have never done  any workup for this since she has remained stable and she has been asymptomatic.   Allergy Studies: none       Charlotte Marvel, MD [facility]

## 2017-06-16 ENCOUNTER — Other Ambulatory Visit: Payer: Self-pay | Admitting: Allergy & Immunology

## 2017-09-23 DIAGNOSIS — J069 Acute upper respiratory infection, unspecified: Secondary | ICD-10-CM | POA: Diagnosis not present

## 2017-09-23 DIAGNOSIS — J019 Acute sinusitis, unspecified: Secondary | ICD-10-CM | POA: Diagnosis not present

## 2017-11-29 ENCOUNTER — Other Ambulatory Visit: Payer: Self-pay | Admitting: Cardiology

## 2017-11-30 ENCOUNTER — Other Ambulatory Visit: Payer: Self-pay | Admitting: Cardiology

## 2017-12-21 ENCOUNTER — Other Ambulatory Visit: Payer: Self-pay | Admitting: Allergy & Immunology

## 2017-12-27 ENCOUNTER — Other Ambulatory Visit: Payer: Self-pay | Admitting: Cardiology

## 2017-12-27 MED ORDER — FUROSEMIDE 20 MG PO TABS: 20.0000 mg | ORAL_TABLET | Freq: Every day | ORAL | 2 refills | Status: DC

## 2018-01-21 ENCOUNTER — Other Ambulatory Visit: Payer: Self-pay | Admitting: Allergy & Immunology

## 2018-02-01 ENCOUNTER — Telehealth: Payer: Self-pay | Admitting: Allergy & Immunology

## 2018-02-01 ENCOUNTER — Other Ambulatory Visit: Payer: Self-pay | Admitting: Allergy & Immunology

## 2018-02-01 MED ORDER — MONTELUKAST SODIUM 10 MG PO TABS: 10.0000 mg | ORAL_TABLET | Freq: Every day | ORAL | 0 refills | Status: DC

## 2018-02-01 NOTE — Telephone Encounter (Signed)
Called and spoke with patient and informed her she is due for OV she made an OV for 02/14/2018 in [place] to see Dr. Dellis Anes. Will send in one more courtesy refill as she stated she was completely out and I informed her that in order to get more refills she would need to keep that OV.

## 2018-02-01 NOTE — Telephone Encounter (Signed)
Patient needs a refill on [facility] [address]

## 2018-02-14 ENCOUNTER — Ambulatory Visit: Payer: BLUE CROSS/BLUE SHIELD | Admitting: Allergy & Immunology

## 2018-02-14 ENCOUNTER — Encounter: Payer: Self-pay | Admitting: Allergy & Immunology

## 2018-02-14 VITALS — BP 130/76 | HR 72 | Temp 98.2°F | Resp 18 | Ht 62.5 in | Wt 178.2 lb

## 2018-02-14 DIAGNOSIS — J3089 Other allergic rhinitis: Secondary | ICD-10-CM | POA: Diagnosis not present

## 2018-02-14 DIAGNOSIS — J4530 Mild persistent asthma, uncomplicated: Secondary | ICD-10-CM | POA: Diagnosis not present

## 2018-02-14 NOTE — Progress Notes (Signed)
FOLLOW UP  Date of Service/Encounter:  02/14/18   Assessment:   Mild persistent asthma, uncomplicated  Perennial allergic rhinitis   Charlotte Chapman has remained completely stable since last visit.  She remains on the Qvar 2 puffs once daily.  She has not needed to increase it to 2 puffs twice daily, which is her plan during respiratory flares.  Because she has been so stable and she is covered with the Singulair as well, we will make her inhaled steroid a as needed medication rather than a daily medication.  This will allow her to have an anti-inflammatory medication that she can use during respiratory flares to avoid the need for prednisone.  Plan/Recommendations:   1. Perennial allergic rhinitis - Continue with alternating Nasonex and Flonase. - Continue with Zyrtec 32m daily. - Continue with Singulair 155mdaily.  2. Mild persistent asthma, uncomplicated - Lung function stable. - Daily controller medication(s): Singulair 1061m Rescue medications: ProAir 4 puffs every 4-6 hours as needed - Changes during respiratory infections or worsening symptoms: restart Qvar 61m24mo 2 puffs twice daily for ONE TO TWO WEEKS. - Asthma control goals:  * Full participation in all desired activities (may need albuterol before activity) * Albuterol use two time or less a week on average (not counting use with activity) * Cough interfering with sleep two time or less a month * Oral steroids no more than once a year * No hospitalizations  4. Return in about 6 months (around 08/15/2018).  Subjective:   Charlotte Chapman 47 y23. female presenting today for follow up of  Chief Complaint  Patient presents with  . Asthma  . Allergic Rhinitis     Charlotte Chapman a history of the following: Patient Active Problem List   Diagnosis Date Noted  . Chronic diastolic CHF (congestive heart failure) (HCC)[place]. Obstructive sleep apnea 04/11/2013  . Asthma   . Atrial septal defect      History obtained from: chart review and patient.  Charlotte Chapman's Primary Care Provider is MillKathyrn Lass.     Charlotte Chapman is a 47 y5. female presenting for a follow up visit.  She was last seen in February 2019.  At that time, her lung function looks stable.  We continued her on Qvar 80 mcg 2 puffs daily, increasing to 2 puffs twice daily during flares.  She has a history of perennial allergic rhinitis.  We continued alternating Nasonex and Flonase.  We also continued Zyrtec and Singulair.  Since the last visit, she has mostly done well.  For whatever reason, her inhaler still cost quite a bit of money.  It was a same when she was on Flovent.  Asthma/Respiratory Symptom History: She remains on the Qvar two puffs once daily. Tyrisha's asthma has been well controlled. She has not required rescue medication, experienced nocturnal awakenings due to lower respiratory symptoms, nor have activities of daily living been limited. She has required no Emergency Department or Urgent Care visits for her asthma. She has required zero courses of systemic steroids for asthma exacerbations since the last visit. ACT score today is  25, indicating excellent asthma symptom control.   Allergic Rhinitis Symptom History: She remains on a nasal steroid.  She tends to just alternate between 2 different ones.  She tends to purchase whatever is on sale.  She is also on the Zyrtec 10 mg daily and the Singulair 10 mg daily.  Otherwise, there have been no changes to her  past medical history, surgical history, family history, or social history.  She has a couple of jobs.  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    Review of Systems: a 14-point review of systems is pertinent for what is mentioned in HPI.  Otherwise, all other systems were negative.  Constitutional: negative other than that listed in the HPI Eyes: negative  other than that listed in the HPI Ears, nose, mouth, throat, and face: negative other than that listed in the HPI Respiratory: negative other than that listed in the HPI Cardiovascular: negative other than that listed in the HPI Gastrointestinal: negative other than that listed in the HPI Genitourinary: negative other than that listed in the HPI Integument: negative other than that listed in the HPI Hematologic: negative other than that listed in the HPI Musculoskeletal: negative other than that listed in the HPI Neurological: negative other than that listed in the HPI Allergy/Immunologic: negative other than that listed in the HPI    Objective:   Blood pressure 130/76, pulse 72, temperature 98.2 F (36.8 C), temperature source Oral, resp. rate 18, height 5' 2.5" (1.588 m), weight 178 lb 3.2 oz (80.8 kg), SpO2 96 %. Body mass index is 32.07 kg/m.   Physical Exam:  General: Alert, interactive, in no acute distress. Pleasant female.  Eyes: No conjunctival injection bilaterally, no discharge on the right, no discharge on the left and no Horner-Trantas dots present. PERRL bilaterally. EOMI without pain. No photophobia.  Ears: Right TM pearly gray with normal light reflex, Left TM pearly gray with normal light reflex, Right TM intact without perforation and Left TM intact without perforation.  Nose/Throat: External nose within normal limits and septum midline. Turbinates edematous and pale with clear discharge. Posterior oropharynx erythematous without cobblestoning in the posterior oropharynx. Tonsils 2+ without exudates.  Tongue without thrush. Lungs: Clear to auscultation without wheezing, rhonchi or rales. No increased work of breathing. CV: Normal S1/S2. No murmurs. Capillary refill <2 seconds.  Skin: Warm and dry, without lesions or rashes. Neuro:   Grossly intact. No focal deficits appreciated. Responsive to questions.  Diagnostic studies:   Spirometry: results abnormal (FEV1:  1.79/64%, FVC: 2.15/62%, FEV1/FVC: 83%).    Spirometry consistent with possible restrictive disease.   Allergy Studies: none      Salvatore Marvel, MD  [facility] [place]

## 2018-02-14 NOTE — Patient Instructions (Addendum)
1. Perennial allergic rhinitis - Continue with alternating Nasonex and Flonase. - Continue with Zyrtec 10mg  daily. - Continue with Singulair 10mg  daily.  2. Mild persistent asthma, uncomplicated - Lung function stable. - Daily controller medication(s): Singulair 10mg  - Rescue medications: ProAir 4 puffs every 4-6 hours as needed - Changes during respiratory infections or worsening symptoms: restart Qvar to 2 puffs twice daily for ONE TO TWO WEEKS. - Asthma control goals:  * Full participation in all desired activities (may need albuterol before activity) * Albuterol use two time or less a week on average (not counting use with activity) * Cough interfering with sleep two time or less a month * Oral steroids no more than once a year * No hospitalizations  4. Return in about 6 months (around 08/15/2018).  Please inform us of any Emergency Department visits, hospitalizations, or changes in symptoms. Call us before going to the ED for breathing or allergy symptoms since we might be able to fit you in for a sick visit. Feel free to contact us anytime with any questions, problems, or concerns.  It was a pleasure to see you again today!   Websites that have reliable patient information: 1. American Academy of Asthma, Allergy, and Immunology: www.aaaai.org 2. Food Allergy Research and Education (FARE): foodallergy.org 3. Mothers of Asthmatics: http://www.asthmacommunitynetwork.org 4. American College of Allergy, Asthma, and Immunology: www.acaai.org

## 2018-02-21 ENCOUNTER — Encounter: Payer: Self-pay | Admitting: Cardiology

## 2018-02-21 ENCOUNTER — Ambulatory Visit: Payer: BLUE CROSS/BLUE SHIELD | Admitting: Cardiology

## 2018-02-21 VITALS — BP 124/86 | HR 66 | Ht 62.5 in | Wt 179.6 lb

## 2018-02-21 DIAGNOSIS — G4733 Obstructive sleep apnea (adult) (pediatric): Secondary | ICD-10-CM

## 2018-02-21 DIAGNOSIS — Q211 Atrial septal defect, unspecified: Secondary | ICD-10-CM

## 2018-02-21 DIAGNOSIS — I5032 Chronic diastolic (congestive) heart failure: Secondary | ICD-10-CM

## 2018-02-21 NOTE — Progress Notes (Signed)
Cardiology Office Note:    Date:  02/21/2018   ID:  Charlotte Chapman, DOB December 19, 1970, MRN [id number]  PCP:  Kathyrn Lass, MD  Cardiologist:  No primary care provider on file.    Referring MD: Kathyrn Lass, MD   Chief Complaint  Patient presents with  . Follow-up    ASD repair, OSA and CHF    History of Present Illness:    Charlotte Chapman is a 47 y.o. female with a hx of ASD s/p repair, chronic diastolic CHF and OSA on CPAP. She is here today for followup and is doing well.  She denies any chest pain or pressure, SOB, DOE, PND, orthopnea, LE edema, dizziness, palpitations or syncope. She is compliant with her meds and is tolerating meds with no SE.  she is doing well with her CPAP device and thinks that she has gotten used to it.  She tolerates the mask and feels the pressure is adequate.  Since going on CPAP she feels rested in the am and has no significant daytime sleepiness.  She denies any significant mouth or nasal dryness or nasal congestion.  She does not think that he snores.     Past Medical History:  Diagnosis Date  . Acne   . Allergic rhinitis   . Asthma    MILD  . Atrial septal defect    s/p repair  . Chronic diastolic CHF (congestive heart failure) ([place])   . Chronic ear infection   . DVT (deep venous thrombosis) ([place])   . Heart murmur   . Obesity   . OSA (obstructive sleep apnea)   . RLS (restless legs syndrome)    Low ferritin 6/12 10/13  . Social anxiety disorder     Past Surgical History:  Procedure Laterality Date  . CARDIAC SURGERY    . TONSILLECTOMY    . TYMPANOSTOMY TUBE PLACEMENT      Current Medications: Current Meds  Medication Sig  . acetaminophen (TYLENOL) 325 MG tablet Take 650 mg by mouth every 6 (six) hours as needed for moderate pain.  Marland Kitchen albuterol (PROAIR HFA) 108 (90 Base) MCG/ACT inhaler Inhale 2 puffs into the lungs every 4 (four) hours as needed for wheezing or shortness of breath.  . ASMANEX 60 METERED DOSES 220 MCG/INH inhaler INHALE  2 PUFFS INTO THE LUNGS DAILY  . beclomethasone (QVAR REDIHALER) 80 MCG/ACT inhaler Inhale 2 puffs into the lungs daily.  . Calcium-Magnesium-Vitamin D (CALCIUM MAGNESIUM PO) Take 1 tablet by mouth daily.  . cetirizine (ZYRTEC) 10 MG tablet Take 10 mg by mouth 2 (two) times daily.  . fluticasone (FLONASE) 50 MCG/ACT nasal spray Place 2 sprays into both nostrils daily.   . furosemide (LASIX) 20 MG tablet Take 1 tablet (20 mg total) by mouth daily. Please keep upcoming appt in November for future refills. Thank you  . guaiFENesin (MUCINEX) 600 MG 12 hr tablet Take 600 mg by mouth 2 (two) times daily as needed.  . IRON PO Take 1 tablet by mouth every other day.  Marland Kitchen KLOR-CON M20 20 MEQ tablet TAKE 1 TABLET BY MOUTH EVERY DAY  . L-Lysine 1000 MG TABS Take 1,000 mg by mouth as needed.  . montelukast (SINGULAIR) 10 MG tablet Take 1 tablet (10 mg total) by mouth daily.  . Multiple Vitamin (MULTIVITAMIN) tablet Take 1 tablet by mouth daily.  Marland Kitchen rOPINIRole (REQUIP) 1 MG tablet Take 1 mg by mouth at bedtime.  . sertraline (ZOLOFT) 100 MG tablet Take 200 mg by mouth daily.  Allergies:   Duraflex   Social History   Socioeconomic History  . Marital status: Single    Spouse name: Not on file  . Number of children: Not on file  . Years of education: Not on file  . Highest education level: Not on file  Occupational History  . Not on file  Social Needs  . Financial resource strain: Not on file  . Food insecurity:    Worry: Not on file    Inability: Not on file  . Transportation needs:    Medical: Not on file    Non-medical: Not on file  Tobacco Use  . Smoking status: Never Smoker  . Smokeless tobacco: Never Used  Substance and Sexual Activity  . Alcohol use: Yes    Alcohol/week: 0.0 standard drinks    Comment: Occasionally.  . Drug use: No  . Sexual activity: Not on file  Lifestyle  . Physical activity:    Days per week: Not on file    Minutes per session: Not on file  . Stress: Not on  file  Relationships  . Social connections:    Talks on phone: Not on file    Gets together: Not on file    Attends religious service: Not on file    Active member of club or organization: Not on file    Attends meetings of clubs or organizations: Not on file    Relationship status: Not on file  Other Topics Concern  . Not on file  Social History Narrative  . Not on file     Family History: The patient's family history includes Asthma in her sister; Autoimmune disease in her mother; CAD in her father; Cancer in her father; Cancer - Prostate in her father; Diabetes in her mother; Heart disease in her father; Hypertension in her father.  ROS:   Please see the history of present illness.    ROS  All other systems reviewed and negative.   EKGs/Labs/Other Studies Reviewed:    The following studies were reviewed today: PAP download  EKG:  EKG is not ordered today.    Recent Labs: No results found for requested labs within last 8760 hours.   Recent Lipid Panel No results found for: CHOL, TRIG, HDL, CHOLHDL, VLDL, LDLCALC, LDLDIRECT  Physical Exam:    VS:  BP 124/86   Pulse 66   Ht 5' 2.5" (1.588 m)   Wt 179 lb 9.6 oz (81.5 kg)   SpO2 90%   BMI 32.33 kg/m     Wt Readings from Last 3 Encounters:  02/21/18 179 lb 9.6 oz (81.5 kg)  02/14/18 178 lb 3.2 oz (80.8 kg)  06/14/17 181 lb 12.8 oz (82.5 kg)     GEN:  Well nourished, well developed in no acute distress HEENT: Normal NECK: No JVD; No carotid bruits LYMPHATICS: No lymphadenopathy CARDIAC: RRR, no murmurs, rubs, gallops RESPIRATORY:  Clear to auscultation without rales, wheezing or rhonchi  ABDOMEN: Soft, non-tender, non-distended MUSCULOSKELETAL:  No edema; No deformity  SKIN: Warm and dry NEUROLOGIC:  Alert and oriented x 3 PSYCHIATRIC:  Normal affect   ASSESSMENT:    1. Chronic diastolic CHF (congestive heart failure) ([place])   2. Atrial septal defect   3. Obstructive sleep apnea    PLAN:    In order  of problems listed above:  1.  Chronic diastolic CHF -she appears euvolemic on exam today.  Her weight is stable.  She will continue on Lasix 20 mg daily.  I will  check a bmet today.  2.  ASD -  status post repair -2D echo 01/17/2017 showed normal LV function with no evidence of residual ASD.  3. OSA - the patient is tolerating PAP therapy well without any problems. The PAP download was reviewed today and showed an AHI of 0.5/hr on 12 cm H2O with 100% compliance in using more than 4 hours nightly.  The patient has been using and benefiting from PAP use and will continue to benefit from therapy.      Medication Adjustments/Labs and Tests Ordered: Current medicines are reviewed at length with the patient today.  Concerns regarding medicines are outlined above.  No orders of the defined types were placed in this encounter.  No orders of the defined types were placed in this encounter.   Signed, Fransico Him, MD  02/21/2018 4:22 PM    [place]

## 2018-02-21 NOTE — Patient Instructions (Signed)
Medication Instructions:  Your physician recommends that you continue on your current medications as directed. Please refer to the Current Medication list given to you today.  If you need a refill on your cardiac medications before your next appointment, please call your pharmacy.   Lab work: TODAY: BMET  If you have labs (blood work) drawn today and your tests are completely normal, you will receive your results only by: Marland Kitchen MyChart Message (if you have MyChart) OR . A paper copy in the mail If you have any lab test that is abnormal or we need to change your treatment, we will call you to review the results.  Testing/Procedures: None ordered  Follow-Up: At [facility], you and your health needs are our priority.  As part of our continuing mission to provide you with exceptional heart care, we have created designated Provider Care Teams.  These Care Teams include your primary Cardiologist (physician) and Advanced Practice Providers (APPs -  Physician Assistants and Nurse Practitioners) who all work together to provide you with the care you need, when you need it. . You will need a follow up appointment in 1 year.  Please call our office 2 months in advance to schedule this appointment.  You may see Armanda Magic, MD or one of the following Advanced Practice Providers on your designated Care Team:   . Robbie Lis, PA-C . Dayna Dunn, PA-C . Jacolyn Reedy, PA-C  Any Other Special Instructions Will Be Listed Below (If Applicable).

## 2018-02-22 ENCOUNTER — Telehealth: Payer: Self-pay

## 2018-02-22 LAB — BASIC METABOLIC PANEL
BUN/Creatinine Ratio: 15 (ref 9–23)
BUN: 13 mg/dL (ref 6–24)
CALCIUM: 9.6 mg/dL (ref 8.7–10.2)
CO2: 24 mmol/L (ref 20–29)
Chloride: 104 mmol/L (ref 96–106)
Creatinine, Ser: 0.88 mg/dL (ref 0.57–1.00)
GFR, EST AFRICAN AMERICAN: 91 mL/min/{1.73_m2} (ref >59)
GFR, EST NON AFRICAN AMERICAN: 79 mL/min/{1.73_m2} (ref >59)
Glucose: 112 mg/dL — ABNORMAL HIGH (ref 65–99)
Potassium: 4.2 mmol/L (ref 3.5–5.2)
Sodium: 145 mmol/L — ABNORMAL HIGH (ref 134–144)

## 2018-02-22 NOTE — Telephone Encounter (Signed)
Patient return called

## 2018-02-22 NOTE — Telephone Encounter (Signed)
-----   Message from Quintella Reichert, MD sent at 02/22/2018 10:39 AM EST ----- Stable labs - continue current meds and forward to PCP

## 2018-02-22 NOTE — Telephone Encounter (Signed)
The patient has been notified of the result and verbalized understanding.  All questions (if any) were answered. Lattie Haw, RN 02/22/2018 4:58 PM

## 2018-02-22 NOTE — Telephone Encounter (Signed)
Notes recorded by Sigurd Sos, RN on 02/22/2018 at 10:48 AM EST [id number] 11/8 ------

## 2018-02-23 ENCOUNTER — Other Ambulatory Visit: Payer: Self-pay | Admitting: Cardiology

## 2018-02-27 ENCOUNTER — Other Ambulatory Visit: Payer: Self-pay | Admitting: Allergy & Immunology

## 2018-03-22 ENCOUNTER — Other Ambulatory Visit: Payer: Self-pay | Admitting: Cardiology

## 2018-07-17 ENCOUNTER — Other Ambulatory Visit: Payer: Self-pay | Admitting: Allergy & Immunology

## 2018-08-15 ENCOUNTER — Encounter: Payer: Self-pay | Admitting: Allergy & Immunology

## 2018-08-15 ENCOUNTER — Ambulatory Visit (INDEPENDENT_AMBULATORY_CARE_PROVIDER_SITE_OTHER): Payer: Self-pay | Admitting: Allergy & Immunology

## 2018-08-15 DIAGNOSIS — J4530 Mild persistent asthma, uncomplicated: Secondary | ICD-10-CM

## 2018-08-15 DIAGNOSIS — J3089 Other allergic rhinitis: Secondary | ICD-10-CM

## 2018-08-15 MED ORDER — MONTELUKAST SODIUM 10 MG PO TABS: 10.0000 mg | ORAL_TABLET | Freq: Every day | ORAL | 5 refills | Status: DC

## 2018-08-15 MED ORDER — ASMANEX (60 METERED DOSES) 220 MCG/INH IN AEPB: 2.0000 | INHALATION_SPRAY | Freq: Every day | RESPIRATORY_TRACT | 5 refills | Status: DC

## 2018-08-15 NOTE — Patient Instructions (Addendum)
1. Perennial allergic rhinitis - Continue with alternating Nasonex daily. - Continue with Zyrtec 10mg  daily. - Continue with Singulair 10mg  daily. - I do not think any medication changes are needed at this time. - To save money, I would consider stopping the Singulair, but I anticipate that it is helping her asthma more than she realizes.  2. Mild persistent asthma, uncomplicated - We are not can make any medication changes at this time. - Daily controller medication(s): Singulair 10mg  - Rescue medications: ProAir 4 puffs every 4-6 hours as needed - Changes during respiratory infections or worsening symptoms: restart Qvar to 2 puffs twice daily for ONE TO TWO WEEKS. - Asthma control goals:  * Full participation in all desired activities (may need albuterol before activity) * Albuterol use two time or less a week on average (not counting use with activity) * Cough interfering with sleep two time or less a month * Oral steroids no more than once a year * No hospitalizations  4. Return in about 6 months (around 02/14/2019).  Please inform us of any Emergency Department visits, hospitalizations, or changes in symptoms. Call us before going to the ED for breathing or allergy symptoms since we might be able to fit you in for a sick visit. Feel free to contact us anytime with any questions, problems, or concerns.  It was a pleasure to see you again today!   Websites that have reliable patient information: 1. American Academy of Asthma, Allergy, and Immunology: www.aaaai.org 2. Food Allergy Research and Education (FARE): foodallergy.org 3. Mothers of Asthmatics: http://www.asthmacommunitynetwork.org 4. American College of Allergy, Asthma, and Immunology: www.acaai.org

## 2018-08-15 NOTE — Progress Notes (Signed)
RE: Charlotte Chapman MRN: [id number] DOB: [birth year] Date of Telemedicine Visit: 08/15/2018  Referring provider: Kathyrn Lass, MD Primary care provider: Kathyrn Lass, MD  Chief Complaint: Follow-up   Telemedicine Follow Up Visit via Telephone: I connected with Charlotte Chapman for a follow up on 08/15/18 by telephone and verified that I am speaking with the correct person using two identifiers.   I discussed the limitations, risks, security and privacy concerns of performing an evaluation and management service by telephone and the availability of in person appointments. I also discussed with the patient that there may be a patient responsible charge related to this service. The patient expressed understanding and agreed to proceed.  Patient is at home accompanied by her dog who provided/contributed to the history.  Provider is at the office.  Visit start time: 3:43 PM Visit end time: 3:58 PM Insurance consent/check in by: [facility] consent and medical assistant/nurse: Ashleigh  History of Present Illness:  She is a 48 y.o. female, who is being followed for her persistent asthma and allergies. Her previous allergy office visit was in persistent asthma as well as allergic rhinitis with Dr. Ernst Bowler.  At the last visit, we continued alternating Nasonex and fluticasone.  We also continued Zyrtec and Singulair.  For her asthma, we continued her on Singulair 10 mg daily with albuterol as needed during flares.  She also has Qvar on hand to use during respiratory flares for 1 to 2 weeks.  Since the last visit, she has mostly done well. She is working at the [employer] and is wearing a mask at work. She is working 10-20 hours per week.  Unfortunately, her other job is in limbo.  She has not filed for unemployment at this point.  Asthma/Respiratory Symptom History: Asthma has been well controlled. She did have some problems last week when she was treated for a sinus infection. She was placed on  prednisone and doxycycline for five days (last day Saturday night). She has not been needing her albuterol often at all. She remains on her montelukast. ACT is 21 indicating poor asthma control.   Allergic Rhinitis Symptom History: She continues to using Nasaonex exclusively. Spring is typically the worst time of the year for her. She uses cetirizine daily. She hasn ot needed antibiotics since the last visit.  Otherwise, there have been no changes to her past medical history, surgical history, family history, or social history.  Assessment and Plan:  [name] is a 48 y.o. female with:  Mild persistent asthma, uncomplicated  Perennial allergic rhinitis  1. Perennial allergic rhinitis - Continue with alternating Nasonex daily. - Continue with Zyrtec 67m daily. - Continue with Singulair 146mdaily. - I do not think any medication changes are needed at this time. - To save money, I would consider stopping the Singulair, but I anticipate that it is helping her asthma more than she realizes.  2. Mild persistent asthma, uncomplicated - We are not can make any medication changes at this time. - Daily controller medication(s): Singulair 1039m Rescue medications: ProAir 4 puffs every 4-6 hours as needed - Changes during respiratory infections or worsening symptoms: restart Qvar 64m40mo 2 puffs twice daily for ONE TO TWO WEEKS. - Asthma control goals:  * Full participation in all desired activities (may need albuterol before activity) * Albuterol use two time or less a week on average (not counting use with activity) * Cough interfering with sleep two time or less a month * Oral steroids no more than  once a year * No hospitalizations  4. Return in about 6 months (around 02/14/2019).   Diagnostics: None.  Medication List:  Current Outpatient Medications  Medication Sig Dispense Refill  . acetaminophen (TYLENOL) 325 MG tablet Take 650 mg by mouth every 6 (six) hours as needed for moderate  pain.    Marland Kitchen albuterol (PROAIR HFA) 108 (90 Base) MCG/ACT inhaler Inhale 2 puffs into the lungs every 4 (four) hours as needed for wheezing or shortness of breath. 3 Inhaler 0  . ASMANEX 60 METERED DOSES 220 MCG/INH inhaler INHALE 2 PUFFS INTO THE LUNGS DAILY 3 Inhaler 3  . beclomethasone (QVAR REDIHALER) 80 MCG/ACT inhaler Inhale 2 puffs into the lungs daily. 1 Inhaler 5  . Calcium-Magnesium-Vitamin D (CALCIUM MAGNESIUM PO) Take 1 tablet by mouth daily.    . cetirizine (ZYRTEC) 10 MG tablet Take 10 mg by mouth 2 (two) times daily.    . fluticasone (FLONASE) 50 MCG/ACT nasal spray Place 2 sprays into both nostrils daily.     . furosemide (LASIX) 20 MG tablet Take 1 tablet (20 mg total) by mouth daily. 30 tablet 10  . guaiFENesin (MUCINEX) 600 MG 12 hr tablet Take 600 mg by mouth 2 (two) times daily as needed.    . IRON PO Take 1 tablet by mouth every other day.    Marland Kitchen KLOR-CON M20 20 MEQ tablet TAKE 1 TABLET BY MOUTH EVERY DAY 90 tablet 3  . L-Lysine 1000 MG TABS Take 1,000 mg by mouth as needed.    . montelukast (SINGULAIR) 10 MG tablet TAKE 1 TABLET BY MOUTH EVERY DAY 90 tablet 1  . Multiple Vitamin (MULTIVITAMIN) tablet Take 1 tablet by mouth daily.    Marland Kitchen rOPINIRole (REQUIP) 1 MG tablet Take 1 mg by mouth at bedtime.    . sertraline (ZOLOFT) 100 MG tablet Take 200 mg by mouth daily.     No current facility-administered medications for this visit.    Allergies: Allergies  Allergen Reactions  . Duraflex    I reviewed her past medical history, social history, family history, and environmental history and no significant changes have been reported from previous visits.  Review of Systems  Constitutional: Negative for activity change and appetite change.  HENT: Negative for congestion, postnasal drip, rhinorrhea, sinus pressure and sore throat.   Eyes: Negative for pain, discharge, redness and itching.  Respiratory: Negative for shortness of breath, wheezing and stridor.   Gastrointestinal:  Negative for diarrhea, nausea and vomiting.  Musculoskeletal: Negative for arthralgias, joint swelling and myalgias.  Skin: Negative for rash.  Allergic/Immunologic: Negative for environmental allergies and food allergies.    Objective:  Physical exam not obtained as encounter was done via telephone.   Previous notes and tests were reviewed.  I discussed the assessment and treatment plan with the patient. The patient was provided an opportunity to ask questions and all were answered. The patient agreed with the plan and demonstrated an understanding of the instructions.   The patient was advised to call back or seek an in-person evaluation if the symptoms worsen or if the condition fails to improve as anticipated.  I provided 15 minutes of non-face-to-face time during this encounter.  It was my pleasure to participate in [place] care today. Please feel free to contact me with any questions or concerns.   Sincerely,  Valentina Shaggy, MD

## 2019-01-10 ENCOUNTER — Other Ambulatory Visit: Payer: Self-pay | Admitting: Allergy & Immunology

## 2019-02-03 ENCOUNTER — Other Ambulatory Visit: Payer: Self-pay | Admitting: Cardiology

## 2019-02-18 ENCOUNTER — Ambulatory Visit: Payer: Self-pay | Admitting: Allergy & Immunology

## 2019-02-25 ENCOUNTER — Ambulatory Visit (INDEPENDENT_AMBULATORY_CARE_PROVIDER_SITE_OTHER): Payer: PRIVATE HEALTH INSURANCE | Admitting: Allergy & Immunology

## 2019-02-25 ENCOUNTER — Encounter: Payer: Self-pay | Admitting: Allergy & Immunology

## 2019-02-25 ENCOUNTER — Other Ambulatory Visit: Payer: Self-pay

## 2019-02-25 VITALS — BP 142/86 | HR 61 | Temp 97.6°F | Resp 16 | Ht 62.0 in | Wt 176.4 lb

## 2019-02-25 DIAGNOSIS — J3089 Other allergic rhinitis: Secondary | ICD-10-CM

## 2019-02-25 DIAGNOSIS — J4530 Mild persistent asthma, uncomplicated: Secondary | ICD-10-CM

## 2019-02-25 MED ORDER — QVAR REDIHALER 80 MCG/ACT IN AERB: 2.0000 | INHALATION_SPRAY | Freq: Every day | RESPIRATORY_TRACT | 5 refills | Status: DC

## 2019-02-25 NOTE — Progress Notes (Signed)
FOLLOW UP  Date of Service/Encounter:  02/25/19   Assessment:   Mild persistent asthma, uncomplicated  Perennial allergic rhinitis  Plan/Recommendations:   1. Perennial allergic rhinitis - Continue with the as needed use of the nose sprays.  - Continue with Zyrtec 38m daily. - Continue with Singulair 131mdaily.  2. Mild persistent asthma, uncomplicated - We are not can make any medication changes at this time. - Lung testing looks stable.  - If the Qvar is too expensive with the [employer], we can try to change the medication so that it is covered better.  - Daily controller medication(s): Singulair 1064m Rescue medications: ProAir 4 puffs every 4-6 hours as needed - Changes during respiratory infections or worsening symptoms: restart Qvar 21m29mo 2 puffs twice daily for ONE TO TWO WEEKS. - Asthma control goals:  * Full participation in all desired activities (may need albuterol before activity) * Albuterol use two time or less a week on average (not counting use with activity) * Cough interfering with sleep two time or less a month * Oral steroids no more than once a year * No hospitalizations  3. Return in about 1 year (around 02/25/2020).   Subjective:   Charlotte Chapman 48 y60. female presenting today for follow up of  Chief Complaint  Patient presents with  . Asthma  . Allergic Rhinitis     Charlotte Chapman a history of the following: Patient Active Problem List   Diagnosis Date Noted  . Chronic diastolic CHF (congestive heart failure) (HCC)[place]. Obstructive sleep apnea 04/11/2013  . Asthma   . Atrial septal defect     History obtained from: chart review and patient.  Charlotte Chapman is a 48 y10. female presenting for a follow up visit.  She was last seen in April 2020.  At that time, we made no medications to her asthma regimen.  We continued her on Singulair and albuterol as needed.  She also has an inhaled steroid that she adds during respiratory flares.   For her rhinitis, we continued with Nasonex, Zyrtec, and Singulair.  Since last visit, she has mostly done well.   Asthma/Respiratory Symptom History: She currently uses two Qvar once daily as needed. She does not have insurance, at least at this point, and her prescription is around $200 for the ICS. She does not use it all that often. Charlotte Chapman's asthma has been well controlled. She has not required rescue medication, experienced nocturnal awakenings due to lower respiratory symptoms, nor have activities of daily living been limited. She has required no Emergency Department or Urgent Care visits for her asthma. She has required zero courses of systemic steroids for asthma exacerbations since the last visit. ACT score today is 24, indicating excellent asthma symptom control.   Allergic Rhinitis Symptom History: Symptoms are well controlled with montelukast daily. She is good about taking this on a daily basis. She has not needed antibiotics at all since the last visit. Overall things are very stable.   She does have restless legs syndrome. She has been on the current regimen for gives years. This is [facility], there have been no changes to her past medical history, surgical history, family history, or social history.    Review of Systems  Constitutional: Negative.  Negative for fever, malaise/fatigue and weight loss.  HENT: Negative.  Negative for congestion, ear discharge, ear pain and sore throat.   Eyes: Negative  for pain, discharge and redness.  Respiratory: Negative for cough, sputum production, shortness of breath and wheezing.   Cardiovascular: Negative.  Negative for chest pain and palpitations.  Gastrointestinal: Negative for abdominal pain, constipation, diarrhea, heartburn, nausea and vomiting.  Skin: Negative.  Negative for itching and rash.  Neurological: Negative for dizziness and headaches.       Positive for restless legs syndrome.   Endo/Heme/Allergies: Negative for environmental allergies. Does not bruise/bleed easily.       Objective:   Blood pressure (!) 142/86, pulse 61, temperature 97.6 F (36.4 C), temperature source Temporal, resp. rate 16, height _0  (1.575 m), weight 176 lb 6.4 oz (80 kg), SpO2 96 %. Body mass index is 32.26 kg/m.   Physical Exam:  Physical Exam  Constitutional: She appears well-developed.  Pleasant female. Cooperative with the exam.   HENT:  Head: Normocephalic and atraumatic.  Right Ear: Tympanic membrane, external ear and ear canal normal.  Left Ear: Tympanic membrane, external ear and ear canal normal.  Nose: Mucosal edema and rhinorrhea present. No nasal deformity or septal deviation. No epistaxis. Right sinus exhibits no maxillary sinus tenderness and no frontal sinus tenderness. Left sinus exhibits no maxillary sinus tenderness and no frontal sinus tenderness.  Mouth/Throat: Uvula is midline and oropharynx is clear and moist. Mucous membranes are not pale and not dry.  Cobblestoning present in the posterior oropharynx.   Eyes: Pupils are equal, round, and reactive to light. Conjunctivae and EOM are normal. Right eye exhibits no chemosis and no discharge. Left eye exhibits no chemosis and no discharge. Right conjunctiva is not injected. Left conjunctiva is not injected.  Cardiovascular: Normal rate, regular rhythm and normal heart sounds.  Respiratory: Effort normal and breath sounds normal. No accessory muscle usage. No tachypnea. No respiratory distress. She has no wheezes. She has no rhonchi. She has no rales. She exhibits no tenderness.  Lymphadenopathy:    She has no cervical adenopathy.  Neurological: She is alert.  Skin: No abrasion, no petechiae and no rash noted. Rash is not papular, not vesicular and not urticarial. No erythema. No pallor.  No eczematous or urticarial lesions noted.   Psychiatric: She has a normal mood and affect.     Diagnostic studies:     Spirometry: results normal (FEV1: 1.82/68%, FVC: 2.26/68%, FEV1/FVC: 81%).    Spirometry consistent with normal pattern. Overall values are stable compared to previous spirometric findings.   Allergy Studies: none      Salvatore Marvel, MD  [facility]

## 2019-02-25 NOTE — Patient Instructions (Addendum)
1. Perennial allergic rhinitis - Continue with the as needed use of the nose sprays.  - Continue with Zyrtec 10mg  daily. - Continue with Singulair 10mg  daily.  2. Mild persistent asthma, uncomplicated - We are not can make any medication changes at this time. - Lung testing looks stable.  - If the Qvar is too expensive with the [employer], we can try to change the medication so that it is covered better.  - Daily controller medication(s): Singulair 10mg  - Rescue medications: ProAir 4 puffs every 4-6 hours as needed - Changes during respiratory infections or worsening symptoms: restart Qvar 62mcg to 2 puffs twice daily for ONE TO TWO WEEKS. - Asthma control goals:  * Full participation in all desired activities (may need albuterol before activity) * Albuterol use two time or less a week on average (not counting use with activity) * Cough interfering with sleep two time or less a month * Oral steroids no more than once a year * No hospitalizations  3. Return in about 1 year (around 02/25/2020).  Please inform us of any Emergency Department visits, hospitalizations, or changes in symptoms. Call us before going to the ED for breathing or allergy symptoms since we might be able to fit you in for a sick visit. Feel free to contact us anytime with any questions, problems, or concerns.  It was a pleasure to see you again today!   Websites that have reliable patient information: 1. American Academy of Asthma, Allergy, and Immunology: www.aaaai.org 2. Food Allergy Research and Education (FARE): foodallergy.org 3. Mothers of Asthmatics: http://www.asthmacommunitynetwork.org 4. American College of Allergy, Asthma, and Immunology: www.acaai.org

## 2019-04-01 ENCOUNTER — Other Ambulatory Visit: Payer: Self-pay | Admitting: Cardiology

## 2019-04-07 ENCOUNTER — Other Ambulatory Visit: Payer: Self-pay | Admitting: Allergy & Immunology

## 2019-05-05 ENCOUNTER — Encounter: Payer: Self-pay | Admitting: Allergy & Immunology

## 2019-05-08 ENCOUNTER — Other Ambulatory Visit: Payer: Self-pay | Admitting: Cardiology

## 2019-06-03 ENCOUNTER — Telehealth: Payer: Self-pay | Admitting: *Deleted

## 2019-06-03 NOTE — Progress Notes (Signed)
Virtual Visit via Video Note   This visit type was conducted due to national recommendations for restrictions regarding the COVID-19 Pandemic (e.g. social distancing) in an effort to limit this patient's exposure and mitigate transmission in our community.  Due to her co-morbid illnesses, this patient is at least at moderate risk for complications without adequate follow up.  This format is felt to be most appropriate for this patient at this time.  All issues noted in this document were discussed and addressed.  A limited physical exam was performed with this format.  Please refer to the patient's chart for her consent to telehealth for [facility].   Evaluation Performed:  Follow-up visit  This visit type was conducted due to national recommendations for restrictions regarding the COVID-19 Pandemic (e.g. social distancing).  This format is felt to be most appropriate for this patient at this time.  All issues noted in this document were discussed and addressed.  No physical exam was performed (except for noted visual exam findings with Video Visits).  Please refer to the patient's chart (MyChart message for video visits and phone note for telephone visits) for the patient's consent to telehealth for [facility].  Date:  06/05/2019   ID:  Charlotte Chapman, DOB [birth year], MRN [id number]  Patient Location:  Home  Provider location:   [facility]  PCP:  Kathyrn Lass, MD  Cardiologist:  Fransico Him, MD Electrophysiologist:  None   Chief Complaint:  CHF, ASD, OSA  History of Present Illness:    Charlotte Chapman is a 49 y.o. female who presents via audio/video conferencing for a telehealth visit today.    Charlotte Chapman is a 49 y.o. female with a hx of ASD s/p repair, chronic diastolic CHF and OSA on CPAP.  She is here today for followup and is doing well.  She denies any chest pain or pressure, SOB, DOE, PND, orthopnea, LE edema, dizziness, palpitations or syncope. She is compliant with  her meds and is tolerating meds with no SE.    She is doing well with her CPAP device and thinks that she has gotten used to it.  She tolerates the mask and feels the pressure is adequate.  She sleeps > 8 hours at night but does not feel that rested in the am and sometimes takes a nap in the am.   She denies any significant mouth or nasal dryness or nasal congestion.  She does not think that she snores.    The patient does not have symptoms concerning for COVID-19 infection (fever, chills, cough, or new shortness of breath).   Prior CV studies:   The following studies were reviewed today:  PAP compliance download from [place], outside labs from PCP on [facility]  Past Medical History:  Diagnosis Date  . Acne   . Allergic rhinitis   . Asthma    MILD  . Atrial septal defect    s/p repair  . Chronic diastolic CHF (congestive heart failure) ([place])   . Chronic ear infection   . DVT (deep venous thrombosis) ([place])   . Heart murmur   . Obesity   . OSA (obstructive sleep apnea)   . RLS (restless legs syndrome)    Low ferritin 6/12 10/13  . Social anxiety disorder    Past Surgical History:  Procedure Laterality Date  . CARDIAC SURGERY    . TONSILLECTOMY    . TYMPANOSTOMY TUBE PLACEMENT       Current Meds  Medication Sig  . acetaminophen (TYLENOL)  325 MG tablet Take 650 mg by mouth every 6 (six) hours as needed for moderate pain.  Marland Kitchen albuterol (PROAIR HFA) 108 (90 Base) MCG/ACT inhaler Inhale 2 puffs into the lungs every 4 (four) hours as needed for wheezing or shortness of breath.  . Calcium-Magnesium-Vitamin D (CALCIUM MAGNESIUM PO) Take 1 tablet by mouth daily.  . cetirizine (ZYRTEC) 10 MG tablet Take 10 mg by mouth 2 (two) times daily.  . fluticasone (FLONASE) 50 MCG/ACT nasal spray Place 2 sprays into both nostrils daily.   . furosemide (LASIX) 20 MG tablet Take 1 tablet (20 mg total) by mouth daily. Please keep appt for future refills.  Marland Kitchen guaiFENesin (MUCINEX) 600 MG 12 hr tablet Take  600 mg by mouth 2 (two) times daily as needed.  . IRON PO Take 1 tablet by mouth every other day.  Marland Kitchen KLOR-CON M20 20 MEQ tablet TAKE 1 TABLET BY MOUTH EVERY DAY  . L-Lysine 1000 MG TABS Take 1,000 mg by mouth as needed.  . montelukast (SINGULAIR) 10 MG tablet TAKE 1 TABLET BY MOUTH EVERY DAY  . Multiple Vitamin (MULTIVITAMIN) tablet Take 1 tablet by mouth daily.  Marland Kitchen rOPINIRole (REQUIP) 1 MG tablet Take 1 mg by mouth at bedtime.  . sertraline (ZOLOFT) 100 MG tablet Take 200 mg by mouth daily.  . [DISCONTINUED] furosemide (LASIX) 20 MG tablet Take 1 tablet (20 mg total) by mouth daily. Please keep appt for future refills.     Allergies:   Duraflex   Social History   Tobacco Use  . Smoking status: Never Smoker  . Smokeless tobacco: Never Used  Substance Use Topics  . Alcohol use: Yes    Alcohol/week: 0.0 standard drinks    Comment: Occasionally.  . Drug use: No     Family Hx: The patient's family history includes Asthma in her sister; Autoimmune disease in her mother; CAD in her father; Cancer in her father; Cancer - Prostate in her father; Diabetes in her mother; Heart disease in her father; Hypertension in her father.  ROS:   Please see the history of present illness.     All other systems reviewed and are negative.   Labs/Other Tests and Data Reviewed:    Recent Labs: No results found for requested labs within last 8760 hours.   Recent Lipid Panel No results found for: CHOL, TRIG, HDL, CHOLHDL, LDLCALC, LDLDIRECT  Wt Readings from Last 3 Encounters:  06/05/19 180 lb (81.6 kg)  02/25/19 176 lb 6.4 oz (80 kg)  02/21/18 179 lb 9.6 oz (81.5 kg)     Objective:    Vital Signs:  Ht 5' 2"  (1.575 m)   Wt 180 lb (81.6 kg)   BMI 32.92 kg/m    CONSTITUTIONAL:  Well nourished, well developed female in no acute distress.  EYES: anicteric MOUTH: oral mucosa is pink RESPIRATORY: Normal respiratory effort, symmetric expansion CARDIOVASCULAR: No peripheral edema SKIN: No  rash, lesions or ulcers MUSCULOSKELETAL: no digital cyanosis NEURO: Cranial Nerves II-XII grossly intact, moves all extremities PSYCH: Intact judgement and insight.  A&O x 3, Mood/affect appropriate   ASSESSMENT & PLAN:    1.  OSA -  The patient is tolerating PAP therapy well without any problems. The PAP download was reviewed today and showed an AHI of 0.7/hr on 12 cm H2O with 100% compliance in using more than 4 hours nightly.  The patient has been using and benefiting from PAP use and will continue to benefit from therapy.   2.  ASD -  s/p repair -2D echo in 2018 with no residual shunt  3.  Chronic diastolic CHF -she has not had any SOB or LE edema -weight is stable -outside labs from PCP on [facility] reviewed showing Creatinine of 0.88 and K+ 4.2 -continue lasix 68m daily  COVID-19 Education: The signs and symptoms of COVID-19 were discussed with the patient and how to seek care for testing (follow up with PCP or arrange E-visit).  The importance of social distancing was discussed today.  Patient Risk:   After full review of this patient's clinical status, I feel that they are at least moderate risk at this time.  Time:   Today, I have spent 20 minutes on telemedicine discussing medical problems including OSA, ASD, CHF and reviewing patient's chart including outside labs from PCP and PAP compliance on [facility].  Medication Adjustments/Labs and Tests Ordered: Current medicines are reviewed at length with the patient today.  Concerns regarding medicines are outlined above.  Tests Ordered: No orders of the defined types were placed in this encounter.  Medication Changes: Meds ordered this encounter  Medications  . furosemide (LASIX) 20 MG tablet    Sig: Take 1 tablet (20 mg total) by mouth daily. Please keep appt for future refills.    Dispense:  90 tablet    Refill:  3    Disposition:  Follow up in 1 year(s)  Signed, TFransico Him MD  06/05/2019 10:34 AM    [facility]

## 2019-06-03 NOTE — Telephone Encounter (Signed)

## 2019-06-05 ENCOUNTER — Telehealth (INDEPENDENT_AMBULATORY_CARE_PROVIDER_SITE_OTHER): Payer: Self-pay | Admitting: Cardiology

## 2019-06-05 ENCOUNTER — Other Ambulatory Visit: Payer: Self-pay

## 2019-06-05 ENCOUNTER — Encounter: Payer: Self-pay | Admitting: Cardiology

## 2019-06-05 VITALS — Ht 62.0 in | Wt 180.0 lb

## 2019-06-05 DIAGNOSIS — I5032 Chronic diastolic (congestive) heart failure: Secondary | ICD-10-CM

## 2019-06-05 DIAGNOSIS — G4733 Obstructive sleep apnea (adult) (pediatric): Secondary | ICD-10-CM

## 2019-06-05 DIAGNOSIS — Q211 Atrial septal defect, unspecified: Secondary | ICD-10-CM

## 2019-06-05 MED ORDER — FUROSEMIDE 20 MG PO TABS: 20.0000 mg | ORAL_TABLET | Freq: Every day | ORAL | 3 refills | Status: DC

## 2019-06-05 NOTE — Patient Instructions (Signed)

## 2019-09-09 ENCOUNTER — Other Ambulatory Visit: Payer: Self-pay | Admitting: Allergy & Immunology

## 2020-03-10 ENCOUNTER — Other Ambulatory Visit: Payer: Self-pay | Admitting: Allergy & Immunology

## 2020-04-08 ENCOUNTER — Other Ambulatory Visit: Payer: Self-pay | Admitting: Allergy & Immunology

## 2020-04-20 ENCOUNTER — Other Ambulatory Visit: Payer: Self-pay

## 2020-04-20 ENCOUNTER — Encounter: Payer: Self-pay | Admitting: Allergy & Immunology

## 2020-04-20 ENCOUNTER — Ambulatory Visit (INDEPENDENT_AMBULATORY_CARE_PROVIDER_SITE_OTHER): Payer: 59 | Admitting: Allergy & Immunology

## 2020-04-20 VITALS — BP 140/80 | HR 68 | Temp 97.5°F | Resp 18 | Ht 62.0 in | Wt 184.2 lb

## 2020-04-20 DIAGNOSIS — J4530 Mild persistent asthma, uncomplicated: Secondary | ICD-10-CM

## 2020-04-20 DIAGNOSIS — J3089 Other allergic rhinitis: Secondary | ICD-10-CM | POA: Diagnosis not present

## 2020-04-20 MED ORDER — MONTELUKAST SODIUM 10 MG PO TABS: 10.0000 mg | ORAL_TABLET | Freq: Every day | ORAL | 5 refills | Status: DC

## 2020-04-20 MED ORDER — ALBUTEROL SULFATE HFA 108 (90 BASE) MCG/ACT IN AERS: 2.0000 | INHALATION_SPRAY | RESPIRATORY_TRACT | 0 refills | Status: DC | PRN

## 2020-04-20 NOTE — Patient Instructions (Addendum)
1. Perennial allergic rhinitis - Continue with the as needed use of the nose sprays.  - Continue with Zyrtec 10mg  daily. - Continue with Singulair 10mg  daily.  2. Mild persistent asthma, uncomplicated - Lung testing looked slightly worse, but I am not going to worry about it since you are feeling well.  - I would like ot see you in 6 months just to make sure it is not worsening.  - Daily controller medication(s): Singulair 10mg  - Rescue medications: ProAir 4 puffs every 4-6 hours as needed - Changes during respiratory infections or worsening symptoms: add [place] 2 puffs twice daily for ONE TO TWO WEEKS. - Asthma control goals:  * Full participation in all desired activities (may need albuterol before activity) * Albuterol use two time or less a week on average (not counting use with activity) * Cough interfering with sleep two time or less a month * Oral steroids no more than once a year * No hospitalizations  3. Return in about 6 months (around 10/18/2020).    Please inform of any Emergency Department visits, hospitalizations, or changes in symptoms. Call before going to the ED for breathing or allergy symptoms since we might be able to fit you in for a sick visit. Feel free to contact 12/19/2020 anytime with any questions, problems, or concerns.  It was a pleasure to see you again today!  Websites that have reliable patient information: 1. American Academy of Asthma, Allergy, and Immunology: www.aaaai.org 2. Food Allergy Research and Education (FARE): foodallergy.org 3. Mothers of Asthmatics: http://www.asthmacommunitynetwork.org 4. American College of Allergy, Asthma, and Immunology: www.acaai.org   COVID-19 Vaccine Information can be found at: [place] For questions related to vaccine distribution or appointments, please email vaccine@[email or web address] or call [phone].     "Like" [place] on Facebook and  Instagram for our latest updates!       Make sure you are registered to vote! If you have moved or changed any of your contact information, you will need to get this updated before voting!  In some cases, you MAY be able to register to vote online: [email or web address]

## 2020-04-20 NOTE — Progress Notes (Signed)
FOLLOW UP  Date of Service/Encounter:  04/20/20   Assessment:   Mild persistent asthma, uncomplicated  Perennial allergic rhinitis  Fully vaccinated (Moderna) - with booster   Overall, it is hard to get a good sense of how well her asthma is controlled.  She tells me all the right things regarding her control, but that she mentions coughing at night.  Her spirometry was also slightly worse than last time.  I think a lot of her symptoms have more to do with the fact that she cannot afford her medications, but she never comes out and says that or confirms it when asked.  We are going to start an affordable inhaled steroid during respiratory flares only and see how that goes.  We are also going to see her in 3 months to make sure that the spirometric changes are not getting worse.  Plan/Recommendations:   1. Perennial allergic rhinitis - Continue with the as needed use of the nose sprays.  - Continue with Zyrtec 59m daily. - Continue with Singulair 19mdaily.  2. Mild persistent asthma, uncomplicated - Lung testing looked slightly worse, but I am not going to worry about it since you are feeling well.  - I would like ot see you in 6 months just to make sure it is not worsening.  - Daily controller medication(s): Singulair 1063m Rescue medications: ProAir 4 puffs every 4-6 hours as needed - Changes during respiratory infections or worsening symptoms: add Alvesco 54m44m puffs twice daily for ONE TO TWO WEEKS. - Asthma control goals:  * Full participation in all desired activities (may need albuterol before activity) * Albuterol use two time or less a week on average (not counting use with activity) * Cough interfering with sleep two time or less a month * Oral steroids no more than once a year * No hospitalizations  3. Return in about 6 months (around 10/18/2020).    Subjective:   Charlotte Chapman 49 y8. female presenting today for follow up of  Chief Complaint   Patient presents with  . Asthma    Hadiya KuhlDeadwyler a history of the following: Patient Active Problem List   Diagnosis Date Noted  . Chronic diastolic CHF (congestive heart failure) (HCC)[place]. Obstructive sleep apnea 04/11/2013  . Asthma   . Atrial septal defect     History obtained from: chart review and patient.  Charlotte Chapman is a 49 y71. female presenting for a follow up visit.  She was last seen in November 2020.  At that time, we continue with as needed no sprays and Zyrtec and Singulair daily.  For her asthma, we did not make any medication changes.  We continued on Singulair 10 mg daily as well as albuterol as needed.  She had Qvar to add twice daily during respiratory flares.  Asthma/Respiratory Symptom History: She reports that she is doing well. She denies coughing at night and SOB. She has not needed to use her inhaled steroid for flares. It takes around 6 months to go through a rescue inhaler. Most of it tends to expire before she gets a new one.   Allergic Rhinitis Symptom History: She is using nose sprays every day. She has not needed antibiotics for sinus infections at all tsince last March 2021. This is normal for her to get one antibiotic annually.   Otherwise, there have been no changes to her past medical history, surgical history, family history, or social history.  Review of Systems  Constitutional: Negative.  Negative for chills, fever, malaise/fatigue and weight loss.  HENT: Negative for congestion, ear discharge, ear pain and sinus pain.   Eyes: Negative for pain, discharge and redness.  Respiratory: Positive for shortness of breath. Negative for cough, sputum production and wheezing.   Cardiovascular: Negative.  Negative for chest pain and palpitations.  Gastrointestinal: Negative for abdominal pain, constipation, diarrhea, heartburn, nausea and vomiting.  Skin: Negative.  Negative for itching and rash.  Neurological: Negative for dizziness and headaches.   Endo/Heme/Allergies: Positive for environmental allergies. Does not bruise/bleed easily.       Objective:   Blood pressure 140/80, pulse 68, temperature (!) 97.5 F (36.4 C), temperature source Temporal, resp. rate 18, height _0  (1.575 m), weight 184 lb 3.2 oz (83.6 kg), SpO2 98 %. Body mass index is 33.69 kg/m.   Physical Exam:  Physical Exam Constitutional:      Appearance: She is well-developed.     Comments: Pleasant female. Cooperative with the exam.   HENT:     Head: Normocephalic and atraumatic.     Right Ear: Tympanic membrane, ear canal and external ear normal.     Left Ear: Tympanic membrane, ear canal and external ear normal.     Nose: No nasal deformity, septal deviation, mucosal edema, rhinorrhea or epistaxis.     Right Turbinates: Enlarged and swollen.     Left Turbinates: Enlarged and swollen.     Right Sinus: No maxillary sinus tenderness or frontal sinus tenderness.     Left Sinus: No maxillary sinus tenderness or frontal sinus tenderness.     Comments: She does have some clear rhinorrhea. No epistaxis.     Mouth/Throat:     Mouth: Oropharynx is clear and moist. Mucous membranes are not pale and not dry.     Pharynx: Uvula midline.  Eyes:     General:        Right eye: No discharge.        Left eye: No discharge.     Extraocular Movements: EOM normal.     Conjunctiva/sclera: Conjunctivae normal.     Right eye: Right conjunctiva is not injected. No chemosis.    Left eye: Left conjunctiva is not injected. No chemosis.    Pupils: Pupils are equal, round, and reactive to light.  Cardiovascular:     Rate and Rhythm: Normal rate and regular rhythm.     Heart sounds: Normal heart sounds.  Pulmonary:     Effort: Pulmonary effort is normal. No tachypnea, accessory muscle usage or respiratory distress.     Breath sounds: Normal breath sounds. No wheezing, rhonchi or rales.     Comments: No increased work of breathing noted.  Chest:     Chest wall: No  tenderness.  Lymphadenopathy:     Cervical: No cervical adenopathy.  Skin:    General: Skin is warm.     Capillary Refill: Capillary refill takes less than 2 seconds.     Coloration: Skin is not pale.     Findings: No abrasion, erythema, petechiae or rash. Rash is not papular, urticarial or vesicular.     Comments: No eczematous or urticarial lesions noted.   Neurological:     Mental Status: She is alert.  Psychiatric:        Mood and Affect: Mood and affect normal.        Behavior: Behavior is cooperative.      Diagnostic studies:    Spirometry: results abnormal (FEV1:  1.56/59%, FVC: 2.03/61%, FEV1/FVC: 77%).    Spirometry consistent with possible restrictive disease.Overall values are slightly lower compared to those obtained at the last visit.  Allergy Studies: none       Salvatore Marvel, MD  [facility]

## 2020-04-21 ENCOUNTER — Encounter: Payer: Self-pay | Admitting: Allergy & Immunology

## 2020-05-09 ENCOUNTER — Other Ambulatory Visit: Payer: Self-pay | Admitting: Allergy & Immunology

## 2020-05-20 ENCOUNTER — Other Ambulatory Visit: Payer: Self-pay | Admitting: Cardiology

## 2020-06-19 ENCOUNTER — Other Ambulatory Visit: Payer: Self-pay | Admitting: Cardiology

## 2020-08-05 ENCOUNTER — Other Ambulatory Visit: Payer: Self-pay | Admitting: Allergy & Immunology

## 2020-08-12 ENCOUNTER — Other Ambulatory Visit: Payer: Self-pay | Admitting: Cardiology

## 2020-08-25 ENCOUNTER — Other Ambulatory Visit: Payer: Self-pay | Admitting: Cardiology

## 2020-08-25 ENCOUNTER — Telehealth: Payer: Self-pay

## 2020-08-25 NOTE — Telephone Encounter (Signed)
Pt called and left voice mail requesting a refill on furosemide. I called pt and left message informing pt that her medication was sent 08/12/20, asking pt to call to schedule an overdue appointment with Dr. Mayford Knife before anymore refills. Pt has not done so. Waiting for pt to call back to schedule an overdue appt.

## 2020-08-29 ENCOUNTER — Other Ambulatory Visit: Payer: Self-pay | Admitting: Cardiology

## 2020-09-24 ENCOUNTER — Other Ambulatory Visit: Payer: Self-pay | Admitting: Cardiology

## 2020-09-29 ENCOUNTER — Telehealth: Payer: Self-pay | Admitting: *Deleted

## 2020-09-29 NOTE — Telephone Encounter (Signed)
   Name: Charlotte Chapman  DOB: 1971-04-03  MRN: [id number]  Primary Cardiologist: None  Pre-op covering staff: - Please add "pre-op clearance" to the appointment notes so provider is aware. Pt is scheduled to see Dr. Radford Pax 04/24/26   If applicable, this message will also be routed to pharmacy pool and/or primary cardiologist for input on holding anticoagulant/antiplatelet agent as requested below so that this information is available to the clearing provider at time of patient's appointment.   Kathyrn Drown, NP  09/29/2020, 2:47 PM

## 2020-09-29 NOTE — Telephone Encounter (Signed)
Appt schedule with Dr Mayford Knife 10/20/20

## 2020-09-29 NOTE — Telephone Encounter (Signed)
   [facility] Pre-operative Risk Assessment    Patient Name: Charlotte Chapman  DOB: [birth year]  MRN: [id number]   HEARTCARE STAFF: - Please ensure there is not already an duplicate clearance open for this procedure. - Under Visit Info/Reason for Call, type in Other and utilize the format Clearance MM/DD/YY or Clearance TBD. Do not use dashes or single digits. - If request is for dental extraction, please clarify the # of teeth to be extracted. - If the patient is currently at the dentist's office, call Pre-Op APP to address. If the patient is not currently in the dentist office, please route to the Pre-Op pool  Request for surgical clearance:  What type of surgery is being performed? Colonoscopy   When is this surgery scheduled? 11/17/2020   What type of clearance is required (medical clearance vs. Pharmacy clearance to hold med vs. Both)? Medical  Are there any medications that need to be held prior to surgery and how long?None   Practice name and name of physician performing surgery? [facility], [place] Dr Juanita Craver   What is the office phone number? [phone]   7.   What is the office fax number?          [phone]  8.   Anesthesia type (None, local, MAC, general) ? Propofol   [name] 09/29/2020, 2:13 PM  _________________________________________________________________   (provider comments below)

## 2020-10-16 ENCOUNTER — Other Ambulatory Visit: Payer: Self-pay | Admitting: Cardiology

## 2020-10-19 NOTE — Progress Notes (Addendum)
Date:  10/20/2020   ID:  Charlotte Chapman, DOB 24-Jul-1970, MRN [id number]  PCP:  Kathyrn Lass, MD  Cardiologist:  Fransico Him, MD Electrophysiologist:  None   Chief Complaint:  CHF, ASD, OSA  History of Present Illness:    Charlotte Chapman is a 50 y.o. female with a hx of ASD s/p repair, chronic diastolic CHF and OSA on CPAP.  She is here today for followup and is doing well.  She denies any chest pain or pressure, SOB, DOE(except when she has to run up stairs fast), PND, orthopnea, dizziness, palpitations or syncope. Occasionally she will have some LE edema.  She is compliant with her meds and is tolerating meds with no SE.   She also has restless legs and this is controlled on Requip.   She is doing well with her CPAP device and thinks that she has gotten used to it.  She tolerates the mask and feels the pressure is adequate.  She has been tossing and turning during sleep recently because her head gear is not fitting well anymore and it wakes her up.   She occasionally will have some mouth and nasal dryness.  She is not sure if she is snoring.   She would like a new headpiece ordered.    Prior CV studies:   The following studies were reviewed today:  PAP compliance download from [facility], outside labs from PCP on [facility]  Past Medical History:  Diagnosis Date   Acne    Allergic rhinitis    Asthma    MILD   Atrial septal defect    s/p repair   Chronic diastolic CHF (congestive heart failure) (HCC)    Chronic ear infection    DVT (deep venous thrombosis) (HCC)    Heart murmur    Obesity    OSA (obstructive sleep apnea)    RLS (restless legs syndrome)    Low ferritin 6/12 10/13   Social anxiety disorder    Past Surgical History:  Procedure Laterality Date   CARDIAC SURGERY     TONSILLECTOMY     TYMPANOSTOMY TUBE PLACEMENT       Current Meds  Medication Sig   acetaminophen (TYLENOL) 325 MG tablet Take 650 mg by mouth every 6 (six) hours as needed for moderate pain.   albuterol  (PROAIR HFA) 108 (90 Base) MCG/ACT inhaler Inhale 2 puffs into the lungs every 4 (four) hours as needed for wheezing or shortness of breath.   Calcium-Magnesium-Vitamin D (CALCIUM MAGNESIUM PO) Take 1 tablet by mouth daily.   cetirizine (ZYRTEC) 10 MG tablet Take 10 mg by mouth 2 (two) times daily.   fluticasone (FLONASE) 50 MCG/ACT nasal spray Place 2 sprays into both nostrils daily.    furosemide (LASIX) 20 MG tablet TAKE 1 TABLET BY MOUTH EVERY DAY   guaiFENesin (MUCINEX) 600 MG 12 hr tablet Take 600 mg by mouth 2 (two) times daily as needed.   IRON PO Take 1 tablet by mouth every other day.   KLOR-CON M20 20 MEQ tablet TAKE 1 TABLET BY MOUTH EVERY DAY   L-Lysine 1000 MG TABS Take 1,000 mg by mouth as needed.   montelukast (SINGULAIR) 10 MG tablet TAKE 1 TABLET BY MOUTH EVERY DAY   Multiple Vitamin (MULTIVITAMIN) tablet Take 1 tablet by mouth daily.   rOPINIRole (REQUIP) 1 MG tablet Take 1 mg by mouth at bedtime.   sertraline (ZOLOFT) 100 MG tablet Take 200 mg by mouth daily.     Allergies:   Duraflex  Social History   Tobacco Use   Smoking status: Never   Smokeless tobacco: Never  Vaping Use   Vaping Use: Never used  Substance Use Topics   Alcohol use: Yes    Alcohol/week: 0.0 standard drinks    Comment: Occasionally.   Drug use: No     Family Hx: The patient's family history includes Asthma in her sister; Autoimmune disease in her mother; CAD in her father; Cancer in her father; Cancer - Prostate in her father; Diabetes in her mother; Heart disease in her father; Hypertension in her father.  ROS:   Please see the history of present illness.     All other systems reviewed and are negative.   Labs/Other Tests and Data Reviewed:    Recent Labs: No results found for requested labs within last 8760 hours.   Recent Lipid Panel No results found for: CHOL, TRIG, HDL, CHOLHDL, LDLCALC, LDLDIRECT  Wt Readings from Last 3 Encounters:  10/20/20 181 lb (82.1 kg)  04/20/20  184 lb 3.2 oz (83.6 kg)  06/05/19 180 lb (81.6 kg)     Objective:    Vital Signs:  BP 110/68   Pulse (!) 54   Ht _0  (1.6 m)   Wt 181 lb (82.1 kg)   SpO2 97%   BMI 32.06 kg/m    GEN: Well nourished, well developed in no acute distress HEENT: Normal NECK: No JVD; No carotid bruits LYMPHATICS: No lymphadenopathy CARDIAC:RRR, no murmurs, rubs, gallops RESPIRATORY:  Clear to auscultation without rales, wheezing or rhonchi  ABDOMEN: Soft, non-tender, non-distended MUSCULOSKELETAL:  No edema; No deformity  SKIN: Warm and dry NEUROLOGIC:  Alert and oriented x 3 PSYCHIATRIC:  Normal affect    EKG was performed in the office today and demonstrated sinus bradycardia with nonspecific T wave abnormality ASSESSMENT & PLAN:    1.  OSA - The patient is tolerating PAP therapy well without any problems. The PAP download performed by his DME was personally reviewed and interpreted by me today and showed an AHI of 0.9/hr on 12 cm H2O with 100% compliance in using more than 4 hours nightly.  The patient has been using and benefiting from PAP use and will continue to benefit from therapy.  -I will place an order for a new headpiece today   2.  ASD -s/p repair -2D echo in 2018 with no residual shunt -repeat echo in 1 year  3.  Chronic diastolic CHF -she is asymptomatic with no SOB unless she runs up stairs and only occsasional minimal LE edema.  -she appears euvolemic on exam today -outside labs from PCP on [facility] reviewed showing SCr 0.76 and K+ 4.7 in Jan 2022 -Continue prescription drug management with Lasix 59m daily>>refilled for 1 year   4  Restless Leg syndrome -this is well controlled on Requip -Continue prescription drug management with Requip 167mqhs>>refilled for 1 year    Medication Adjustments/Labs and Tests Ordered: Current medicines are reviewed at length with the patient today.  Concerns regarding medicines are outlined above.  Tests Ordered: No orders of the defined  types were placed in this encounter.  Medication Changes: No orders of the defined types were placed in this encounter.   Disposition:  Follow up in 1 year(s)  Signed, TrFransico HimMD  10/20/2020 9:47 AM    [facility]

## 2020-10-20 ENCOUNTER — Other Ambulatory Visit: Payer: Self-pay

## 2020-10-20 ENCOUNTER — Ambulatory Visit: Payer: No Typology Code available for payment source | Admitting: Cardiology

## 2020-10-20 ENCOUNTER — Encounter: Payer: Self-pay | Admitting: Cardiology

## 2020-10-20 VITALS — BP 110/68 | HR 54 | Ht 63.0 in | Wt 181.0 lb

## 2020-10-20 DIAGNOSIS — G4733 Obstructive sleep apnea (adult) (pediatric): Secondary | ICD-10-CM

## 2020-10-20 DIAGNOSIS — Q211 Atrial septal defect, unspecified: Secondary | ICD-10-CM

## 2020-10-20 DIAGNOSIS — I5032 Chronic diastolic (congestive) heart failure: Secondary | ICD-10-CM

## 2020-10-20 DIAGNOSIS — G2581 Restless legs syndrome: Secondary | ICD-10-CM | POA: Diagnosis not present

## 2020-10-20 MED ORDER — POTASSIUM CHLORIDE CRYS ER 20 MEQ PO TBCR: 20.0000 meq | EXTENDED_RELEASE_TABLET | Freq: Every day | ORAL | 3 refills | Status: DC

## 2020-10-20 MED ORDER — ROPINIROLE HCL 1 MG PO TABS
1.0000 mg | ORAL_TABLET | Freq: Every day | ORAL | 3 refills | Status: DC
Start: 2020-10-20 — End: 2021-10-31

## 2020-10-20 NOTE — Addendum Note (Signed)
Addended by: Theresia Majors on: 10/20/2020 09:51 AM   Modules accepted: Orders

## 2020-10-20 NOTE — Patient Instructions (Signed)

## 2020-10-21 ENCOUNTER — Telehealth: Payer: Self-pay | Admitting: *Deleted

## 2020-10-21 DIAGNOSIS — G4733 Obstructive sleep apnea (adult) (pediatric): Secondary | ICD-10-CM

## 2020-10-21 NOTE — Telephone Encounter (Signed)
Order placed to [facility] for new headgear/mask.

## 2020-10-21 NOTE — Telephone Encounter (Signed)
-----   Message from Theresia Majors, RN sent at 10/20/2020  9:53 AM EDT ----- Please order patient a new headgear/mask.  Thanks!

## 2020-10-25 NOTE — Addendum Note (Signed)
Addended by: Madalyn Rob A on: 10/25/2020 08:02 AM   Modules accepted: Orders

## 2020-10-26 ENCOUNTER — Encounter: Payer: Self-pay | Admitting: Allergy & Immunology

## 2020-10-26 ENCOUNTER — Other Ambulatory Visit: Payer: Self-pay

## 2020-10-26 ENCOUNTER — Ambulatory Visit: Payer: Self-pay | Admitting: Allergy & Immunology

## 2020-10-26 VITALS — BP 116/70 | HR 64 | Temp 97.6°F | Resp 18

## 2020-10-26 DIAGNOSIS — J3089 Other allergic rhinitis: Secondary | ICD-10-CM | POA: Diagnosis not present

## 2020-10-26 DIAGNOSIS — J4530 Mild persistent asthma, uncomplicated: Secondary | ICD-10-CM | POA: Diagnosis not present

## 2020-10-26 NOTE — Patient Instructions (Addendum)
1. Perennial allergic rhinitis - Continue with the as needed use of the nose sprays.  - Continue with Zyrtec 10mg  daily. - Continue with Singulair 10mg  daily.  2. Mild persistent asthma, uncomplicated - Lung testing looked stable.  - Daily controller medication(s): Singulair 10mg  - Rescue medications: ProAir 4 puffs every 4-6 hours as needed - Changes during respiratory infections or worsening symptoms: add Alvesco 2 puffs twice daily for ONE TO TWO WEEKS. - Asthma control goals:  * Full participation in all desired activities (may need albuterol before activity) * Albuterol use two time or less a week on average (not counting use with activity) * Cough interfering with sleep two time or less a month * Oral steroids no more than once a year * No hospitalizations  3. Return in about 1 year (around 10/26/2021).    Please inform of any Emergency Department visits, hospitalizations, or changes in symptoms. Call before going to the ED for breathing or allergy symptoms since we might be able to fit you in for a sick visit. Feel free to contact 12/27/2021 anytime with any questions, problems, or concerns.  It was a pleasure to see you again today!  Websites that have reliable patient information: 1. American Academy of Asthma, Allergy, and Immunology: www.aaaai.org 2. Food Allergy Research and Education (FARE): foodallergy.org 3. Mothers of Asthmatics: http://www.asthmacommunitynetwork.org 4. American College of Allergy, Asthma, and Immunology: www.acaai.org   COVID-19 Vaccine Information can be found at: [place] For questions related to vaccine distribution or appointments, please email [email or web address] or call [phone].   We realize that you might be concerned about having an allergic reaction to the COVID19 vaccines. To help with that concern, WE ARE OFFERING THE COVID19 VACCINES IN OUR OFFICE! Ask the  front desk for dates!     "Like" [place] on Facebook and Instagram for our latest updates!      A healthy democracy works best when [email or web address] participate! Make sure you are registered to vote! If you have moved or changed any of your contact information, you will need to get this updated before voting!  In some cases, you MAY be able to register to vote online: [phone]

## 2020-10-26 NOTE — Progress Notes (Signed)
FOLLOW UP  Date of Service/Encounter:  10/26/20   Assessment:   Mild persistent asthma, uncomplicated   Perennial allergic rhinitis   Fully vaccinated [name] - with booster  Plan/Recommendations:   1. Perennial allergic rhinitis - Continue with the as needed use of the nose sprays.  - Continue with Zyrtec 39m daily. - Continue with Singulair 177mdaily.  2. Mild persistent asthma, uncomplicated - Lung testing looked stable.  - Daily controller medication(s): Singulair 1076m Rescue medications: ProAir 4 puffs every 4-6 hours as needed - Changes during respiratory infections or worsening symptoms: add Alvesco 12m70m puffs twice daily for ONE TO TWO WEEKS. - Asthma control goals:  * Full participation in all desired activities (may need albuterol before activity) * Albuterol use two time or less a week on average (not counting use with activity) * Cough interfering with sleep two time or less a month * Oral steroids no more than once a year * No hospitalizations  3. Return in about 1 year (around 10/26/2021).   Subjective:   [place] KuhlFreemana 50 y89. female presenting today for follow up of  Chief Complaint  Patient presents with   Asthma    Tiani KuhlMcvicker a history of the following: Patient Active Problem List   Diagnosis Date Noted   Mild persistent asthma, uncomplicated 07/154/56/2563erennial allergic rhinitis 10/26/2020   Chronic diastolic CHF (congestive heart failure) (HCC)[place] Obstructive sleep apnea 04/11/2013   Asthma    Atrial septal defect     History obtained from: chart review and patient.  Raea is a 50 y64. female presenting for a follow up visit.  She has last seen in January 2022.  At that time, she seemed to be doing well.  Her lung testing looks slightly worse.  I did not worry about it since she was feeling symptomatically well.  Continued on Singulair 10 mg daily as well as albuterol 4 puffs every 4-6 hours as needed.  She does have  Alvesco that she has during respiratory flares.  For her rhinitis, we continue with Zyrtec as well as Singulair.  Since the last visit, she has done well.   Asthma/Respiratory Symptom History: She is using her rescue inhaler a couple of times per week. She is using her montelukast daily. She denies any side effects. She does have OSA and needs new head gear to hold her CPAP in place over her face. Brittiany's asthma has been well controlled. She has not required rescue medication, experienced nocturnal awakenings due to lower respiratory symptoms, nor have activities of daily living been limited. She has required no Emergency Department or Urgent Care visits for her asthma. She has required zero courses of systemic steroids for asthma exacerbations since the last visit. ACT score today is 25, indicating excellent asthma symptom control.   Allergic Rhinitis Symptom History: She does have sneezing every other day. It is really bad when they are cutting the grass. This is weekly.  She uses the cetirizine and the Singulair every day.  She does not like the nose sprays.  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 This was an isolated event.   She tells me that she continues to work at the [employer] has customers that are generally grumpy and irritated. She works there 30-40 hours per week.  She said there are enough nights customers to make up for it.  Otherwise, there have been no changes  to her past medical history, surgical history, family history, or social history.    Review of Systems  Constitutional: Negative.  Negative for fever, malaise/fatigue and weight loss.  HENT: Negative.  Negative for congestion, ear discharge and ear pain.   Eyes:  Negative for pain, discharge and redness.  Respiratory:  Negative for cough, sputum production, shortness of breath and wheezing.   Cardiovascular: Negative.  Negative for chest pain and palpitations.  Gastrointestinal:  Negative for abdominal  pain, constipation, diarrhea, heartburn, nausea and vomiting.  Skin: Negative.  Negative for itching and rash.  Neurological:  Negative for dizziness and headaches.  Endo/Heme/Allergies:  Negative for environmental allergies. Does not bruise/bleed easily.      Objective:   Blood pressure 116/70, pulse 64, temperature 97.6 F (36.4 C), temperature source Temporal, resp. rate 18, SpO2 95 %. There is no height or weight on file to calculate BMI.   Physical Exam:  Physical Exam Constitutional:      Appearance: She is well-developed.     Comments: Pleasant.  Somewhat flat affect.  I did get her to smile a few times.  HENT:     Head: Normocephalic and atraumatic.     Right Ear: Tympanic membrane, ear canal and external ear normal.     Left Ear: Tympanic membrane, ear canal and external ear normal.     Nose: No nasal deformity, septal deviation, mucosal edema or rhinorrhea.     Right Turbinates: Enlarged and swollen. Not pale.     Left Turbinates: Enlarged and swollen. Not pale.     Right Sinus: No maxillary sinus tenderness or frontal sinus tenderness.     Left Sinus: No maxillary sinus tenderness or frontal sinus tenderness.     Mouth/Throat:     Mouth: Mucous membranes are not pale and not dry.     Pharynx: Uvula midline.  Eyes:     General: Lids are normal. No allergic shiner.       Right eye: No discharge.        Left eye: No discharge.     Conjunctiva/sclera: Conjunctivae normal.     Right eye: Right conjunctiva is not injected. No chemosis.    Left eye: Left conjunctiva is not injected. No chemosis.    Pupils: Pupils are equal, round, and reactive to light.  Cardiovascular:     Rate and Rhythm: Normal rate and regular rhythm.     Heart sounds: Normal heart sounds.  Pulmonary:     Effort: Pulmonary effort is normal. No tachypnea, accessory muscle usage or respiratory distress.     Breath sounds: Normal breath sounds. No wheezing, rhonchi or rales.     Comments: Moving air  well in all lung fields.  No increased work of breathing. Chest:     Chest wall: No tenderness.  Lymphadenopathy:     Cervical: No cervical adenopathy.  Skin:    General: Skin is warm.     Capillary Refill: Capillary refill takes less than 2 seconds.     Coloration: Skin is not pale.     Findings: No abrasion, erythema, petechiae or rash. Rash is not papular, urticarial or vesicular.     Comments: No eczematous or urticarial lesions noted.  Neurological:     Mental Status: She is alert.  Psychiatric:        Behavior: Behavior is cooperative.     Diagnostic studies:    Spirometry: results abnormal (FEV1: 1.63L, FVC: 2.04L, FEV1/FVC: 80%).    Spirometry consistent with possible  restrictive disease.  Overall, values are stable compared to those obtained at the last visit.  Allergy Studies:  none          Salvatore Marvel, MD  [facility]

## 2021-01-31 ENCOUNTER — Other Ambulatory Visit: Payer: Self-pay | Admitting: Allergy & Immunology

## 2021-08-04 ENCOUNTER — Other Ambulatory Visit: Payer: Self-pay | Admitting: Allergy & Immunology

## 2021-08-08 ENCOUNTER — Other Ambulatory Visit: Payer: Self-pay | Admitting: Cardiology

## 2021-08-09 MED ORDER — POTASSIUM CHLORIDE CRYS ER 20 MEQ PO TBCR: 20.0000 meq | EXTENDED_RELEASE_TABLET | Freq: Every day | ORAL | 0 refills | Status: DC

## 2021-09-06 ENCOUNTER — Other Ambulatory Visit: Payer: Self-pay

## 2021-09-06 ENCOUNTER — Emergency Department (HOSPITAL_COMMUNITY)
Admission: EM | Admit: 2021-09-06 | Discharge: 2021-09-07 | Disposition: A | Discharge status: Home / Self Care | Payer: No Typology Code available for payment source | Attending: Emergency Medicine | Admitting: Emergency Medicine

## 2021-09-06 DIAGNOSIS — J3489 Other specified disorders of nose and nasal sinuses: Secondary | ICD-10-CM | POA: Diagnosis present

## 2021-09-06 DIAGNOSIS — R059 Cough, unspecified: Secondary | ICD-10-CM | POA: Diagnosis not present

## 2021-09-06 DIAGNOSIS — R0989 Other specified symptoms and signs involving the circulatory and respiratory systems: Secondary | ICD-10-CM | POA: Insufficient documentation

## 2021-09-06 DIAGNOSIS — R1110 Vomiting, unspecified: Secondary | ICD-10-CM | POA: Insufficient documentation

## 2021-09-06 DIAGNOSIS — R0981 Nasal congestion: Secondary | ICD-10-CM | POA: Diagnosis not present

## 2021-09-06 DIAGNOSIS — R0982 Postnasal drip: Secondary | ICD-10-CM | POA: Diagnosis not present

## 2021-09-06 DIAGNOSIS — J0111 Acute recurrent frontal sinusitis: Secondary | ICD-10-CM

## 2021-09-06 MED ORDER — ONDANSETRON 4 MG PO TBDP
8.0000 mg | ORAL_TABLET | Freq: Once | ORAL | Status: AC
  Administered 2021-09-06: 8 mg via ORAL
  Filled 2021-09-06: qty 2

## 2021-09-06 MED ORDER — ACETAMINOPHEN 325 MG PO TABS
650.0000 mg | ORAL_TABLET | Freq: Once | ORAL | Status: AC
  Administered 2021-09-06: 650 mg via ORAL
  Filled 2021-09-06: qty 2

## 2021-09-06 MED ORDER — AMOXICILLIN-POT CLAVULANATE 875-125 MG PO TABS
1.0000 | ORAL_TABLET | Freq: Once | ORAL | Status: AC
  Administered 2021-09-06: 1 via ORAL
  Filled 2021-09-06: qty 1

## 2021-09-06 NOTE — ED Provider Triage Note (Signed)
Emergency Medicine Provider Triage Evaluation Note  Shanekia Sangster , a 51 y.o. female  was evaluated in triage.  Pt complains of sinus pain and pressure for the past week.  Had an episode of emesis and abdominal discomfort around noon.  No other episodes of emesis and her abdominal discomfort has improved.  Had congestion in her chest which she feels like is from her sinuses.  No chest pain or shortness of breath.  Review of Systems  Positive: Chest congestion, sinus pain and pressure, emesis Negative: Chest pain  Physical Exam  BP (!) 124/97 (BP Location: Left Arm)   Pulse (!) 104   Temp (!) 101.6 F (38.7 C) (Oral)   Resp 18   Ht 5' 3"  (1.6 m)   Wt 83.9 kg   SpO2 96%   BMI 32.77 kg/m  Gen:   Awake, no distress   Resp:  Normal effort  MSK:   Moves extremities without difficulty  Other:  Maxillary and frontal sinus pressure.  Patient slightly tachycardic but speaking complete sentences without difficulty  Medical Decision Making  Medically screening exam initiated at 8:02 PM.  Appropriate orders placed.  Razan Warrior was informed that the remainder of the evaluation will be completed by another provider, this initial triage assessment does not replace that evaluation, and the importance of remaining in the ED until their evaluation is complete.  We will order EKG due to chest congestion.  Given Tylenol for fever   Delia Heady, PA-C 09/06/21 2004

## 2021-09-06 NOTE — ED Triage Notes (Signed)
Patient c/o sinus pressure/headache and vomiting that started at 1200 today.  Patient denies diarrhea.

## 2021-09-06 NOTE — ED Provider Notes (Signed)
[facility] EMERGENCY DEPARTMENT Provider Note   CSN: [id number] Arrival date & time: 09/06/21  1950     History {Add pertinent medical, surgical, social history, OB history to HPI:1} Chief Complaint  Patient presents with   Facial Pain    Charlotte Chapman is a 51 y.o. female.  51 year old female presents the ER today with a weeks worth of sinus pain, postnasal drip, cough and chest congestion.  States she gets sinus infections about once a year and this is similar to those.  She states that she is also had some posttussive emesis and also some emesis associated with the postnasal drip.  No persistent or specific abdominal pain aside from with emesis.  No urinary symptoms.  No trauma or other associated symptoms.   Home Medications Prior to Admission medications   Medication Sig Start Date End Date Taking? Authorizing Provider  acetaminophen (TYLENOL) 325 MG tablet Take 650 mg by mouth every 6 (six) hours as needed for moderate pain.    [provider]  albuterol (PROAIR HFA) 108 (90 Base) MCG/ACT inhaler Inhale 2 puffs into the lungs every 4 (four) hours as needed for wheezing or shortness of breath. 04/20/20   Valentina Shaggy, MD  Calcium-Magnesium-Vitamin D (CALCIUM MAGNESIUM PO) Take 1 tablet by mouth daily.    [provider]  cetirizine (ZYRTEC) 10 MG tablet Take 10 mg by mouth 2 (two) times daily.    [provider]  fluticasone (FLONASE) 50 MCG/ACT nasal spray Place 2 sprays into both nostrils daily.     [provider]  furosemide (LASIX) 20 MG tablet TAKE 1 TABLET BY MOUTH EVERY DAY 08/09/21   Sueanne Margarita, MD  guaiFENesin (MUCINEX) 600 MG 12 hr tablet Take 600 mg by mouth 2 (two) times daily as needed.    [provider]  IRON PO Take 1 tablet by mouth every other day.    [provider]  L-Lysine 1000 MG TABS Take 1,000 mg by mouth as needed.    [provider]  montelukast (SINGULAIR) 10  MG tablet TAKE 1 TABLET BY MOUTH EVERY DAY 08/04/21   Valentina Shaggy, MD  Multiple Vitamin (MULTIVITAMIN) tablet Take 1 tablet by mouth daily.    [provider]  potassium chloride SA (KLOR-CON M20) 20 MEQ tablet Take 1 tablet (20 mEq total) by mouth daily. 08/09/21   Sueanne Margarita, MD  rOPINIRole (REQUIP) 1 MG tablet Take 1 tablet (1 mg total) by mouth at bedtime. 10/20/20   Sueanne Margarita, MD  sertraline (ZOLOFT) 100 MG tablet Take 200 mg by mouth daily.    [provider]      Allergies    Duraflex    Review of Systems   Review of Systems  Physical Exam Updated Vital Signs BP 110/69   Pulse 72   Temp (!) 101.6 F (38.7 C) (Oral)   Resp 16   Ht 5' 3"  (1.6 m)   Wt 83.9 kg   SpO2 97%   BMI 32.77 kg/m  Physical Exam Vitals and nursing note reviewed.  Constitutional:      Appearance: She is well-developed.  HENT:     Head: Normocephalic and atraumatic.     Nose: Congestion and rhinorrhea present.     Right Sinus: Maxillary sinus tenderness and frontal sinus tenderness present.     Left Sinus: Maxillary sinus tenderness and frontal sinus tenderness present.     Mouth/Throat:     Mouth: Mucous membranes  are moist.     Pharynx: Oropharynx is clear.  Eyes:     Pupils: Pupils are equal, round, and reactive to light.  Cardiovascular:     Rate and Rhythm: Normal rate and regular rhythm.  Pulmonary:     Effort: No respiratory distress.     Breath sounds: No stridor.  Abdominal:     General: Abdomen is flat. There is no distension.  Musculoskeletal:     Cervical back: Normal range of motion.  Skin:    General: Skin is warm and dry.  Neurological:     General: No focal deficit present.     Mental Status: She is alert.    ED Results / Procedures / Treatments   Labs (all labs ordered are listed, but only abnormal results are displayed) Labs Reviewed  CBC WITH DIFFERENTIAL/PLATELET  COMPREHENSIVE METABOLIC PANEL  LIPASE, BLOOD    EKG EKG  Interpretation  Date/Time:  Tuesday Sep 06 2021 20:06:37 EDT Ventricular Rate:  96 PR Interval:  128 QRS Duration: 74 QT Interval:  350 QTC Calculation: 442 R Axis:   9 Text Interpretation: Normal sinus rhythm Normal ECG When compared with ECG of 24-Nov-2012 11:15, PREVIOUS ECG IS PRESENT Confirmed by Merrily Pew [phone]) on 09/06/2021 11:21:12 PM  Radiology No results found.  Procedures Procedures  {Document cardiac monitor, telemetry assessment procedure when appropriate:1}  Medications Ordered in ED Medications  amoxicillin-clavulanate (AUGMENTIN) 875-125 MG per tablet 1 tablet (has no administration in time range)  ondansetron (ZOFRAN-ODT) disintegrating tablet 8 mg (has no administration in time range)  acetaminophen (TYLENOL) tablet 650 mg (650 mg Oral Given 09/06/21 2002)    ED Course/ Medical Decision Making/ A&P                           Medical Decision Making Amount and/or Complexity of Data Reviewed Labs: ordered. Radiology: ordered.  Risk Prescription drug management.  Likely bacterial sinusitis with there fever. However with cough and emesis, will check labs/cxr.  ***  {Document critical care time when appropriate:1} {Document review of labs and clinical decision tools ie heart score, Chads2Vasc2 etc:1}  {Document your independent review of radiology images, and any outside records:1} {Document your discussion with family members, caretakers, and with consultants:1} {Document social determinants of health affecting pt's care:1} {Document your decision making why or why not admission, treatments were needed:1} Final Clinical Impression(s) / ED Diagnoses Final diagnoses:  None    Rx / DC Orders ED Discharge Orders     None

## 2021-09-07 ENCOUNTER — Emergency Department (HOSPITAL_COMMUNITY): Payer: No Typology Code available for payment source

## 2021-09-07 LAB — CBC WITH DIFFERENTIAL/PLATELET
Abs Immature Granulocytes: 0.04 10*3/uL (ref 0.00–0.07)
Basophils Absolute: 0 10*3/uL (ref 0.0–0.1)
Basophils Relative: 0 %
Eosinophils Absolute: 0 10*3/uL (ref 0.0–0.5)
Eosinophils Relative: 0 %
HCT: 43.7 % (ref 36.0–46.0)
Hemoglobin: 14.8 g/dL (ref 12.0–15.0)
Immature Granulocytes: 0 %
Lymphocytes Relative: 6 %
Lymphs Abs: 0.6 10*3/uL — ABNORMAL LOW (ref 0.7–4.0)
MCH: 30.6 pg (ref 26.0–34.0)
MCHC: 33.9 g/dL (ref 30.0–36.0)
MCV: 90.5 fL (ref 80.0–100.0)
Monocytes Absolute: 0.5 10*3/uL (ref 0.1–1.0)
Monocytes Relative: 5 %
Neutro Abs: 9.3 10*3/uL — ABNORMAL HIGH (ref 1.7–7.7)
Neutrophils Relative %: 89 %
Platelets: 217 10*3/uL (ref 150–400)
RBC: 4.83 MIL/uL (ref 3.87–5.11)
RDW: 13.2 % (ref 11.5–15.5)
WBC: 10.5 10*3/uL (ref 4.0–10.5)
nRBC: 0 % (ref 0.0–0.2)

## 2021-09-07 LAB — COMPREHENSIVE METABOLIC PANEL
ALT: 16 U/L (ref 0–44)
AST: 21 U/L (ref 15–41)
Albumin: 4 g/dL (ref 3.5–5.0)
Alkaline Phosphatase: 58 U/L (ref 38–126)
Anion gap: 10 (ref 5–15)
BUN: 11 mg/dL (ref 6–20)
CO2: 19 mmol/L — ABNORMAL LOW (ref 22–32)
Calcium: 8.6 mg/dL — ABNORMAL LOW (ref 8.9–10.3)
Chloride: 109 mmol/L (ref 98–111)
Creatinine, Ser: 0.78 mg/dL (ref 0.44–1.00)
GFR, Estimated: >60 mL/min (ref >60)
Glucose, Bld: 127 mg/dL — ABNORMAL HIGH (ref 70–99)
Potassium: 3.5 mmol/L (ref 3.5–5.1)
Sodium: 138 mmol/L (ref 135–145)
Total Bilirubin: 0.9 mg/dL (ref 0.3–1.2)
Total Protein: 6.8 g/dL (ref 6.5–8.1)

## 2021-09-07 LAB — LIPASE, BLOOD: Lipase: 42 U/L (ref 11–51)

## 2021-09-07 MED ORDER — PROMETHAZINE HCL 25 MG PO TABS: 25.0000 mg | ORAL_TABLET | Freq: Four times a day (QID) | ORAL | 0 refills | Status: DC | PRN

## 2021-09-07 MED ORDER — AMOXICILLIN-POT CLAVULANATE 875-125 MG PO TABS: 1.0000 | ORAL_TABLET | Freq: Two times a day (BID) | ORAL | 0 refills | Status: DC

## 2021-09-07 NOTE — ED Notes (Signed)
Pt able to tolerate crackers and ginger ale.  

## 2021-09-07 NOTE — ED Notes (Signed)
Pt able to ambulate to the bathroom with no assistance.

## 2021-09-07 NOTE — ED Notes (Signed)
Pt provided with saltine crackers and a ginger ale and encourage to to eat.

## 2021-09-09 ENCOUNTER — Encounter (INDEPENDENT_AMBULATORY_CARE_PROVIDER_SITE_OTHER): Payer: No Typology Code available for payment source | Admitting: Allergy & Immunology

## 2021-09-09 DIAGNOSIS — J0100 Acute maxillary sinusitis, unspecified: Secondary | ICD-10-CM

## 2021-09-14 NOTE — Telephone Encounter (Signed)

## 2021-10-29 ENCOUNTER — Other Ambulatory Visit: Payer: Self-pay | Admitting: Cardiology

## 2021-10-29 ENCOUNTER — Other Ambulatory Visit: Payer: Self-pay | Admitting: Allergy & Immunology

## 2021-10-31 MED ORDER — POTASSIUM CHLORIDE CRYS ER 20 MEQ PO TBCR: 20.0000 meq | EXTENDED_RELEASE_TABLET | Freq: Every day | ORAL | 0 refills | Status: DC

## 2021-10-31 NOTE — Telephone Encounter (Signed)
Pt's pharmacy is requesting a refill on ropinirole. Would Dr. Turner like to refill this medication? Please address 

## 2021-11-01 ENCOUNTER — Encounter: Payer: Self-pay | Admitting: Allergy & Immunology

## 2021-11-01 ENCOUNTER — Ambulatory Visit (INDEPENDENT_AMBULATORY_CARE_PROVIDER_SITE_OTHER): Payer: No Typology Code available for payment source | Admitting: Allergy & Immunology

## 2021-11-01 VITALS — BP 122/72 | HR 65 | Temp 97.8°F | Resp 20 | Wt 190.0 lb

## 2021-11-01 DIAGNOSIS — Z8774 Personal history of (corrected) congenital malformations of heart and circulatory system: Secondary | ICD-10-CM | POA: Insufficient documentation

## 2021-11-01 DIAGNOSIS — J4530 Mild persistent asthma, uncomplicated: Secondary | ICD-10-CM | POA: Diagnosis not present

## 2021-11-01 DIAGNOSIS — J3089 Other allergic rhinitis: Secondary | ICD-10-CM | POA: Diagnosis not present

## 2021-11-01 DIAGNOSIS — N920 Excessive and frequent menstruation with regular cycle: Secondary | ICD-10-CM | POA: Insufficient documentation

## 2021-11-01 DIAGNOSIS — G2581 Restless legs syndrome: Secondary | ICD-10-CM | POA: Insufficient documentation

## 2021-11-01 DIAGNOSIS — F4011 Social phobia, generalized: Secondary | ICD-10-CM | POA: Insufficient documentation

## 2021-11-01 DIAGNOSIS — F411 Generalized anxiety disorder: Secondary | ICD-10-CM | POA: Insufficient documentation

## 2021-11-01 DIAGNOSIS — Z86718 Personal history of other venous thrombosis and embolism: Secondary | ICD-10-CM | POA: Insufficient documentation
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
## 2021-11-01 NOTE — Patient Instructions (Addendum)
1. Perennial allergic rhinitis - Stop the current nose spray. - Start Ryaltris one spray per nostril twice daily (contains a nasal steroid and a nasal antihistamine).  - Continue with Zyrtec 10mg  daily. - Continue with Singulair 10mg  daily.  2. Mild persistent asthma, uncomplicated - Lung testing looked worse today.  - Daily controller medication(s): Singulair 10mg  - Rescue medications: albuterol 4 puffs every 4-6 hours as needed - Changes during respiratory infections or worsening symptoms: add Alvesco 2 puffs twice daily for ONE TO TWO WEEKS. - Asthma control goals:  * Full participation in all desired activities (may need albuterol before activity) * Albuterol use two time or less a week on average (not counting use with activity) * Cough interfering with sleep two time or less a month * Oral steroids no more than once a year * No hospitalizations  3. Return in about 6 months (around 05/04/2022).    Please inform of any Emergency Department visits, hospitalizations, or changes in symptoms. Call before going to the ED for breathing or allergy symptoms since we might be able to fit you in for a sick visit. Feel free to contact 05/06/2022 anytime with any questions, problems, or concerns.  It was a pleasure to see you again today!  Websites that have reliable patient information: 1. American Academy of Asthma, Allergy, and Immunology: www.aaaai.org 2. Food Allergy Research and Education (FARE): foodallergy.org 3. Mothers of Asthmatics: http://www.asthmacommunitynetwork.org 4. American College of Allergy, Asthma, and Immunology: www.acaai.org   COVID-19 Vaccine Information can be found at: [place] For questions related to vaccine distribution or appointments, please email [email or web address] or call [phone].   We realize that you might be concerned about having an allergic reaction to the COVID19  vaccines. To help with that concern, WE ARE OFFERING THE COVID19 VACCINES IN OUR OFFICE! Ask the front desk for dates!     "Like" [place] on Facebook and Instagram for our latest updates!      A healthy democracy works best when [email or web address] participate! Make sure you are registered to vote! If you have moved or changed any of your contact information, you will need to get this updated before voting!  In some cases, you MAY be able to register to vote online: [phone]

## 2021-11-17 ENCOUNTER — Other Ambulatory Visit: Payer: Self-pay | Admitting: Allergy & Immunology

## 2021-11-18 ENCOUNTER — Telehealth: Payer: Self-pay | Admitting: Allergy & Immunology

## 2021-11-18 MED ORDER — ALBUTEROL SULFATE HFA 108 (90 BASE) MCG/ACT IN AERS: 2.0000 | INHALATION_SPRAY | Freq: Four times a day (QID) | RESPIRATORY_TRACT | 1 refills | Status: DC | PRN

## 2021-11-18 NOTE — Telephone Encounter (Signed)
Per LOV note patient is on Albuterol HFA for rescue, there is no mention of patient being on Xopenex in addition and this is not on patient's current med list.  Rx for Albuterol HFA sent to pharmacy as requested.

## 2021-11-18 NOTE — Telephone Encounter (Signed)
Patient is needing a refill on her Albuterol and Xopenex inhaler sent to [facility] [address]

## 2021-11-22 ENCOUNTER — Other Ambulatory Visit: Payer: Self-pay | Admitting: Cardiology

## 2021-12-08 ENCOUNTER — Other Ambulatory Visit: Payer: Self-pay | Admitting: Cardiology

## 2021-12-11 ENCOUNTER — Other Ambulatory Visit: Payer: Self-pay | Admitting: Cardiology

## 2021-12-16 ENCOUNTER — Other Ambulatory Visit: Payer: Self-pay | Admitting: Cardiology

## 2021-12-20 ENCOUNTER — Other Ambulatory Visit: Payer: Self-pay | Admitting: Cardiology

## 2021-12-20 MED ORDER — POTASSIUM CHLORIDE CRYS ER 20 MEQ PO TBCR: 20.0000 meq | EXTENDED_RELEASE_TABLET | Freq: Every day | ORAL | 0 refills | Status: DC

## 2021-12-20 MED ORDER — ROPINIROLE HCL 1 MG PO TABS: 1.0000 mg | ORAL_TABLET | Freq: Every day | ORAL | 0 refills | Status: DC

## 2021-12-20 NOTE — Telephone Encounter (Signed)
Pt is requesting a refill on ropinirole. Would Dr. Mayford Knife like to refill this medication? Please address

## 2021-12-20 NOTE — Telephone Encounter (Signed)
*  STAT* If patient is at the pharmacy, call can be transferred to refill team.   1. Which medications need to be refilled? (please list name of each medication and dose if known)   rOPINIRole (REQUIP) 1 MG tablet (5 tablets left)  potassium chloride SA (KLOR-CON M20) 20 MEQ tablet (completely out)  2. Which pharmacy/location (including street and city if local pharmacy) is medication to be sent to?  [id number] [place], [place] - [address]  3. Do they need a 30 day or 90 day supply? 30 day  Patient stated she is out/running out of these medications. Patient has an appointment scheduled on 01/11/22.

## 2022-01-04 NOTE — Progress Notes (Signed)
Cardiology Office Note:    Date:  01/11/2022   ID:  Charlotte Chapman, DOB [birth year], MRN [id number]  PCP:  Kathyrn Lass, MD  [facility] Cardiologist:  Fransico Him, MD     Referring MD: Kathyrn Lass, MD   Chief Complaint:  No chief complaint on file.     History of Present Illness:   Charlotte Chapman is a 51 y.o. female with   with a hx of ASD s/p repair, chronic diastolic CHF and OSA on CPAP.  Patient last saw Dr. Radford Pax 10/2020 and doing well.   Plan for repeat echo in 1 yr.  Patient comes in for f/u. Just got back from a trip to celebrate her father's 94th birthday. Bp up today but she thinks it's from cough medicine and inhalers she had this am. BP usually 120/80. Uses CPAP regularly but was off for the past 5 days due to traveling. Walks 15-20 min daily. Has also been off lasix for a week. Eating out-fast food.    Past Medical History:  Diagnosis Date   Acne    Allergic rhinitis    Asthma    MILD   Atrial septal defect    s/p repair   Chronic diastolic CHF (congestive heart failure) (HCC)    Chronic ear infection    DVT (deep venous thrombosis) (HCC)    Heart murmur    Obesity    OSA (obstructive sleep apnea)    RLS (restless legs syndrome)    Low ferritin 6/12 10/13   Social anxiety disorder    Current Medications: Current Meds  Medication Sig   acetaminophen (TYLENOL) 325 MG tablet Take 650 mg by mouth every 6 (six) hours as needed for moderate pain.   albuterol (VENTOLIN HFA) 108 (90 Base) MCG/ACT inhaler Inhale 2 puffs into the lungs every 6 (six) hours as needed for wheezing or shortness of breath.   Calcium-Magnesium-Vitamin D (CALCIUM MAGNESIUM PO) Take 1 tablet by mouth daily.   cetirizine (ZYRTEC) 10 MG tablet Take 10 mg by mouth 2 (two) times daily.   fluticasone (FLONASE) 50 MCG/ACT nasal spray Place 2 sprays into both nostrils daily.    guaiFENesin (MUCINEX) 600 MG 12 hr tablet Take 600 mg by mouth 2 (two) times daily as needed.   IRON  PO Take 1 tablet by mouth every other day.   L-Lysine 1000 MG TABS Take 1,000 mg by mouth as needed.   montelukast (SINGULAIR) 10 MG tablet TAKE 1 TABLET BY MOUTH EVERY DAY   Multiple Vitamin (MULTIVITAMIN) tablet Take 1 tablet by mouth daily.   rOPINIRole (REQUIP) 1 MG tablet Take 1 tablet (1 mg total) by mouth at bedtime.   sertraline (ZOLOFT) 100 MG tablet Take 200 mg by mouth daily.   [DISCONTINUED] furosemide (LASIX) 20 MG tablet TAKE 1 TABLET BY MOUTH EVERY DAY   [DISCONTINUED] KLOR-CON M20 20 MEQ tablet TAKE 1 TABLET BY MOUTH EVERY DAY    Allergies:   Cat hair extract, Dust mite extract, Tree extract, and Duraflex   Social History   Tobacco Use   Smoking status: Never   Smokeless tobacco: Never  Vaping Use   Vaping Use: Never used  Substance Use Topics   Alcohol use: Yes    Alcohol/week: 0.0 standard drinks of alcohol    Comment: Occasionally.   Drug use: No    Family Hx: The patient's family history includes Asthma in her sister; Autoimmune disease in her mother; CAD in her father; Cancer in her father;  Cancer - Prostate in her father; Diabetes in her mother; Heart disease in her father; Hypertension in her father.  ROS   EKGs/Labs/Other Test Reviewed:    EKG:  EKG is  not ordered today.  T   Recent Labs: 09/07/2021: ALT 16; BUN 11; Creatinine, Ser 0.78; Hemoglobin 14.8; Platelets 217; Potassium 3.5; Sodium 138   Recent Lipid Panel No results for input(s): "CHOL", "TRIG", "HDL", "VLDL", "LDLCALC", "LDLDIRECT" in the last 8760 hours.   Prior CV Studies: ECHO COMPLETE WO IMAGING ENHANCING AGENT 01/05/2017  Narrative *[facility]* [address], [place] [address] [phone]  ------------------------------------------------------------------- Transthoracic Echocardiography  Patient:    Charlotte, Chapman MR #:       [id number] Study Date: 01/05/2017 Gender:     F Age:        5 Height:     160 cm Weight:     85 kg BSA:        1.98 m^2 Pt.  Status: Room:  ATTENDING    Fransico Him, MD ORDERING     Fransico Him, MD [place], MD SONOGRAPHER  [place]  cc:  ------------------------------------------------------------------- LV EF: 45% -   50%  ------------------------------------------------------------------- Indications:      Q21.1 Atrial septal defect.  ------------------------------------------------------------------- History:   PMH:  Atrial septal defect repair. Sleep apnea (on CPAP). Obesity.  Congestive heart failure.  ------------------------------------------------------------------- Study Conclusions  - Left ventricle: The cavity size was normal. Systolic function was mildly reduced. The estimated ejection fraction was in the range of 45% to 50%. Left ventricular diastolic function parameters were normal. - Atrial septum: Poorly visualized. No defect or patent foramen ovale was identified. S/P ASD repair. - Tricuspid valve: There was trivial regurgitation. - Recommendations: Repeat limited echo with definity contrast to better assess wall motion.  Recommendations:  Repeat limited echo with definity contrast to better assess wall motion.  ------------------------------------------------------------------- Study data:  Comparison was made to the study of 11/24/2012.  Study status:  Routine.  Procedure:  The patient reported no pain pre or post test. Transthoracic echocardiography. Image quality was adequate.  Study completion:  There were no complications. Transthoracic echocardiography.  M-mode, complete 2D, spectral Doppler, and color Doppler.  Birthdate:  Patient birthdate: [birth year].  Age:  Patient is 51 yr old.  Sex:  Gender: female. BMI: 33.2 kg/m^2.  Blood pressure:     114/75  Patient status: Outpatient.  Study date:  Study date: 01/05/2017. Study time: 03:49 PM.  Location:  [facility]  -------------------------------------------------------------------  ------------------------------------------------------------------- Left ventricle:  The cavity size was normal. Systolic function was mildly reduced. The estimated ejection fraction was in the range of 45% to 50%. Images were inadequate for LV wall motion assessment. The transmitral flow pattern was normal. The deceleration time of the early transmitral flow velocity was normal. The pulmonary vein flow pattern was normal. The tissue Doppler parameters were normal. Left ventricular diastolic function parameters were normal.  ------------------------------------------------------------------- Aortic valve:   Trileaflet; normal thickness leaflets. Mobility was not restricted.  Doppler:  Transvalvular velocity was within the normal range. There was no stenosis. There was no regurgitation.  ------------------------------------------------------------------- Aorta:  Aortic root: The aortic root was normal in size.  ------------------------------------------------------------------- Mitral valve:   Structurally normal valve.   Mobility was not restricted.  Doppler:  Transvalvular velocity was within the normal range. There was no evidence for stenosis. There was no regurgitation.    Peak gradient (  D): 3 mm Hg.  ------------------------------------------------------------------- Left atrium:  The atrium was normal in size.  ------------------------------------------------------------------- Atrial septum:  Poorly visualized. No defect or patent foramen ovale was identified. S/P ASD repair.  ------------------------------------------------------------------- Right ventricle:  The cavity size was normal. Wall thickness was normal. Systolic function was normal.  ------------------------------------------------------------------- Pulmonic valve:    Structurally normal valve.   Cusp separation was normal.  Doppler:   Transvalvular velocity was within the normal range. There was no evidence for stenosis. There was no regurgitation.  ------------------------------------------------------------------- Tricuspid valve:   Structurally normal valve.    Doppler: Transvalvular velocity was within the normal range. There was trivial regurgitation.  ------------------------------------------------------------------- Pulmonary artery:   The main pulmonary artery was normal-sized. Systolic pressure was within the normal range.  ------------------------------------------------------------------- Right atrium:  The atrium was normal in size.  ------------------------------------------------------------------- Pericardium:  There was no pericardial effusion.  ------------------------------------------------------------------- Systemic veins: Inferior vena cava: The vessel was normal in size.  ------------------------------------------------------------------- Measurements  Left ventricle                         Value        Reference LV ID, ED, PLAX chordal        (L)     35.2  mm     43 - 52 LV ID, ES, PLAX chordal                25.6  mm     23 - 38 LV fx shortening, PLAX chordal (L)     27    %      >=29 LV PW thickness, ED                    13.5  mm     ---------- IVS/LV PW ratio, ED                    0.68         <=1.3 LV e&', lateral                         8.38  cm/s   ---------- LV E/e&', lateral                       9.89         ---------- LV e&', medial                          8.27  cm/s   ---------- LV E/e&', medial                        10.02        ---------- LV e&', average                         8.33  cm/s   ---------- LV E/e&', average                       9.96         ----------  Ventricular septum                     Value        Reference IVS thickness, ED  9.21  mm     ----------  LVOT                                   Value        Reference LVOT ID, S                              18    mm     ---------- LVOT area                              2.54  cm^2   ----------  Aorta                                  Value        Reference Aortic root ID, ED                     27    mm     ----------  Left atrium                            Value        Reference LA ID, A-P, ES                         34    mm     ---------- LA ID/bsa, A-P                         1.72  cm/m^2 <=2.2 LA volume, S                           42.5  ml     ---------- LA volume/bsa, S                       21.5  ml/m^2 ---------- LA volume, ES, 1-p A4C                 36.7  ml     ---------- LA volume/bsa, ES, 1-p A4C             18.5  ml/m^2 ---------- LA volume, ES, 1-p A2C                 47.7  ml     ---------- LA volume/bsa, ES, 1-p A2C             24.1  ml/m^2 ----------  Mitral valve                           Value        Reference Mitral E-wave peak velocity            82.9  cm/s   ---------- Mitral A-wave peak velocity            69.8  cm/s   ---------- Mitral deceleration time       (H)     254   ms     150 - 230 Mitral peak gradient, D  3     mm Hg  ---------- Mitral E/A ratio, peak                 1.2          ----------  Pulmonary arteries                     Value        Reference PA pressure, S, DP                     28    mm Hg  <=30  Tricuspid valve                        Value        Reference Tricuspid regurg peak velocity         250   cm/s   ---------- Tricuspid peak RV-RA gradient          25    mm Hg  ----------  Right atrium                           Value        Reference RA ID, S-I, ES, A4C                    46.2  mm     34 - 49 RA area, ES, A4C                       10.1  cm^2   8.3 - 19.5 RA volume, ES, A/L                     18.6  ml     ---------- RA volume/bsa, ES, A/L                 9.4   ml/m^2 ----------  Systemic veins                         Value        Reference Estimated CVP                          3     mm Hg   ----------  Right ventricle                        Value        Reference RV pressure, S, DP                     28    mm Hg  <=30 RV s&', lateral, S                      11.6  cm/s   ----------  Legend: (L)  and  (H)  mark values outside specified reference range.  ------------------------------------------------------------------- Prepared and Electronically Authenticated by  Fransico Him, MD 2018-09-21T16:44:33         Risk Assessment/Calculations/Metrics:         HYPERTENSION CONTROL Vitals:   01/11/22 1156 01/11/22 1220  BP: (!) 152/100 (!) 170/110    The patient's blood pressure is elevated above target today.  In order to address the patient's elevated BP: Blood pressure will be monitored at home  to determine if medication changes need to be made.; A return visit for a blood pressure check with the RN or CMA will be arranged.       Physical Exam:    VS:  BP (!) 170/110   Pulse (!) 54   Ht _0  (1.6 m)   Wt 183 lb (83 kg)   SpO2 98%   BMI 32.42 kg/m     Wt Readings from Last 3 Encounters:  01/11/22 183 lb (83 kg)  11/01/21 190 lb (86.2 kg)  09/06/21 185 lb (83.9 kg)    Physical Exam  GEN: Obese, in no acute distress  Neck: no JVD, carotid bruits, or masses Cardiac:RRR; no murmurs, rubs, or gallops  Respiratory:  clear to auscultation bilaterally, normal work of breathing GI: soft, nontender, nondistended, + BS Ext: without cyanosis, clubbing, or edema, Good distal pulses bilaterally Neuro:  Alert and Oriented x 3,  Psych: euthymic mood, full affect       ASSESSMENT & PLAN:   No problem-specific Assessment & Plan notes found for this encounter.   HTN-BP running high on all checks today. Out of lasix for a week, traveling, eating fast food, inhalers and cough meds. Will refill lasix today and have her take 40 mg and 2 K. 2 gm sodium diet, daily BP checks and f/u BP next week. Call if still running high.  Chronic diastolic CHF compensated  ASD  repair stable 2018 repeat  OSA on CPAP-hasn't used in the past week with traveling.  Restless leg syndrome on Requip.          Dispo:  No follow-ups on file.   Medication Adjustments/Labs and Tests Ordered: Current medicines are reviewed at length with the patient today.  Concerns regarding medicines are outlined above.  Tests Ordered: Orders Placed This Encounter  Procedures   ECHOCARDIOGRAM COMPLETE   Medication Changes: Meds ordered this encounter  Medications   potassium chloride SA (KLOR-CON M20) 20 MEQ tablet    Sig: Take 1 tablet (20 mEq total) by mouth daily.    Dispense:  90 tablet    Refill:  3   furosemide (LASIX) 20 MG tablet    Sig: Take 1 tablet (20 mg total) by mouth daily.    Dispense:  90 tablet    Refill:  3    Please call our office to schedule an overdue appointment with Dr. Radford Pax before anymore refills. [phone]. Thank you 2nd attempt   Signed, Ermalinda Barrios, PA-C  01/11/2022 12:29 PM    [facility], [place], [place]  [address] Phone: [phone]; Fax: [phone]

## 2022-01-11 ENCOUNTER — Ambulatory Visit: Payer: No Typology Code available for payment source | Attending: Physician Assistant | Admitting: Physician Assistant

## 2022-01-11 ENCOUNTER — Encounter: Payer: Self-pay | Admitting: Physician Assistant

## 2022-01-11 ENCOUNTER — Other Ambulatory Visit: Payer: Self-pay | Admitting: Cardiology

## 2022-01-11 VITALS — BP 170/110 | HR 54 | Ht 63.0 in | Wt 183.0 lb

## 2022-01-11 DIAGNOSIS — G4733 Obstructive sleep apnea (adult) (pediatric): Secondary | ICD-10-CM | POA: Diagnosis not present

## 2022-01-11 DIAGNOSIS — Q211 Atrial septal defect, unspecified: Secondary | ICD-10-CM | POA: Diagnosis not present

## 2022-01-11 DIAGNOSIS — I10 Essential (primary) hypertension: Secondary | ICD-10-CM

## 2022-01-11 DIAGNOSIS — I5032 Chronic diastolic (congestive) heart failure: Secondary | ICD-10-CM | POA: Diagnosis not present

## 2022-01-11 DIAGNOSIS — G2581 Restless legs syndrome: Secondary | ICD-10-CM

## 2022-01-11 MED ORDER — FUROSEMIDE 20 MG PO TABS: 20.0000 mg | ORAL_TABLET | Freq: Every day | ORAL | 3 refills | Status: DC

## 2022-01-11 MED ORDER — POTASSIUM CHLORIDE CRYS ER 20 MEQ PO TBCR: 20.0000 meq | EXTENDED_RELEASE_TABLET | Freq: Every day | ORAL | 3 refills | Status: DC

## 2022-01-11 NOTE — Patient Instructions (Signed)
Medication Instructions:  Your physician recommends that you continue on your current medications as directed. Please refer to the Current Medication list given to you today.  *If you need a refill on your cardiac medications before your next appointment, please call your pharmacy*  Take 2 lasix 20mg  tabs and 2 potassium 40meq tablets to day then resume 1 tablet each daily  Follow-Up: At [facility], you and your health needs are our priority.  As part of our continuing mission to provide you with exceptional heart care, we have created designated Provider Care Teams.  These Care Teams include your primary Cardiologist (physician) and Advanced Practice Providers (APPs -  Physician Assistants and Nurse Practitioners) who all work together to provide you with the care you need, when you need it.  We recommend signing up for the patient portal called "MyChart".  Sign up information is provided on this After Visit Summary.  MyChart is used to connect with patients for Virtual Visits (Telemedicine).  Patients are able to view lab/test results, encounter notes, upcoming appointments, etc.  Non-urgent messages can be sent to your provider as well.   To learn more about what you can do with MyChart, go to [email or web address].    Your next appointment:   1 week(s)  The format for your next appointment:   In Person  Provider:   Ermalinda Barrios, PA-C        Other Instructions Check BP daily and keep a record for next appointment

## 2022-01-12 ENCOUNTER — Other Ambulatory Visit: Payer: Self-pay | Admitting: Cardiology

## 2022-01-12 NOTE — Telephone Encounter (Signed)
Pt's pharmacy is requesting a refill on ropinirole. Would Dr. Turner like to refill this medication? Please address 

## 2022-01-16 ENCOUNTER — Other Ambulatory Visit: Payer: Self-pay | Admitting: Allergy & Immunology

## 2022-01-16 NOTE — Telephone Encounter (Signed)
Pts insurance does not cover ventolin but covers xopenexplease advise to change

## 2022-01-20 ENCOUNTER — Ambulatory Visit: Payer: No Typology Code available for payment source | Attending: Cardiology

## 2022-01-20 VITALS — BP 150/90 | HR 60 | Ht 63.0 in | Wt 183.0 lb

## 2022-01-20 DIAGNOSIS — I10 Essential (primary) hypertension: Secondary | ICD-10-CM

## 2022-01-20 NOTE — Patient Instructions (Addendum)
Medication Instructions:  Your physician recommends that you continue on your current medications as directed. Please refer to the Current Medication list given to you today.  *If you need a refill on your cardiac medications before your next appointment, please call your pharmacy*   Lab Work: None ordered.  If you have labs (blood work) drawn today and your tests are completely normal, you will receive your results only by: [place] (if you have MyChart) OR A paper copy in the mail If you have any lab test that is abnormal or we need to change your treatment, we will call you to review the results.   Testing/Procedures: None ordered.    Follow-Up: At [facility], you and your health needs are our priority.  As part of our continuing mission to provide you with exceptional heart care, we have created designated Provider Care Teams.  These Care Teams include your primary Cardiologist (physician) and Advanced Practice Providers (APPs -  Physician Assistants and Nurse Practitioners) who all work together to provide you with the care you need, when you need it.  We recommend signing up for the patient portal called "MyChart".  Sign up information is provided on this After Visit Summary.  MyChart is used to connect with patients for Virtual Visits (Telemedicine).  Patients are able to view lab/test results, encounter notes, upcoming appointments, etc.  Non-urgent messages can be sent to your provider as well.   To learn more about what you can do with MyChart, go to [email or web address].    Your next appointment:   Follow up as planned  Optimal BP is 120/80  Continue to monitor BP at home and call for consistent elevations greater than 140/90.  Important Information About Sugar

## 2022-01-20 NOTE — Progress Notes (Signed)
   Nurse Visit   Date of Encounter: 01/20/2022 ID: Charlotte Chapman, DOB [birth year], MRN [id number]  PCP:  Patient, No Pcp Per   [facility] Cardiologist:  Fransico Him, MD      Visit Details   VS:  BP (!) 150/90   Pulse 60   Ht $R'5\' 3"'LF$  (1.6 m)   Wt 183 lb (83 kg)   BMI 32.42 kg/m  , BMI Body mass index is 32.42 kg/m.  Wt Readings from Last 3 Encounters:  01/20/22 183 lb (83 kg)  01/11/22 183 lb (83 kg)  11/01/21 190 lb (86.2 kg)     Reason for visit: BP recheck per M.Lenze,PA-C Performed today: Vital Signs,Education and Consulted with Dr Donn Pierini. Changes (medications, testing, etc.) : No Changes Length of Visit: 20 minutes    Medications Adjustments/Labs and Tests Ordered: No orders of the defined types were placed in this encounter.  No orders of the defined types were placed in this encounter.  Pt presents today for BP recheck.  She brings with her a log of BP's from home that range from 115-141/60's-80's.  Reviewed with Dr Quentin Ore who recommends no changes at this time, continue to monitor BP at home.  Pt advised to call office if she sees consistent elevations in BP. Follow up as planned.  Pt verbalizes understanding and agrees with current plan.  Signed, Thora Lance, RN  01/20/2022 11:36 AM

## 2022-01-24 ENCOUNTER — Ambulatory Visit (HOSPITAL_COMMUNITY): Payer: No Typology Code available for payment source | Attending: Cardiology

## 2022-01-24 DIAGNOSIS — Q211 Atrial septal defect, unspecified: Secondary | ICD-10-CM | POA: Diagnosis present

## 2022-01-24 LAB — ECHOCARDIOGRAM COMPLETE
Area-P 1/2: 2.07 cm2
S' Lateral: 3.1 cm

## 2022-01-27 ENCOUNTER — Telehealth: Payer: Self-pay | Admitting: Cardiology

## 2022-01-27 ENCOUNTER — Telehealth: Payer: Self-pay

## 2022-01-27 DIAGNOSIS — Q211 Atrial septal defect, unspecified: Secondary | ICD-10-CM

## 2022-01-27 DIAGNOSIS — I5032 Chronic diastolic (congestive) heart failure: Secondary | ICD-10-CM

## 2022-01-27 NOTE — Telephone Encounter (Signed)
Patient is returning call in regards to echo results. Transferred to Antonieta Iba, RN.

## 2022-01-27 NOTE — Telephone Encounter (Signed)
The patient has been notified of the result and verbalized understanding.  All questions (if any) were answered. Antonieta Iba, RN 01/27/2022 1:20 PM  Cardiac MRI has been ordered.

## 2022-01-27 NOTE — Telephone Encounter (Signed)
See above encounter.

## 2022-01-27 NOTE — Telephone Encounter (Signed)
-----   Message from Sueanne Margarita, MD sent at 01/26/2022  9:31 AM EDT ----- 2D echo showed normal heart function with severe RV enlargement with normal PAP, trivial leakiness of the MV, mild to moderate leakiness of the TV and stable ASD repair with no shunt.  The RV enlargement is new so I would like her to have a cardiac MRI with gad to assess RV further

## 2022-03-02 ENCOUNTER — Other Ambulatory Visit: Payer: Self-pay

## 2022-03-02 MED ORDER — ALBUTEROL SULFATE HFA 108 (90 BASE) MCG/ACT IN AERS: INHALATION_SPRAY | RESPIRATORY_TRACT | 1 refills | Status: DC

## 2022-04-25 ENCOUNTER — Other Ambulatory Visit: Payer: Self-pay | Admitting: Allergy & Immunology

## 2022-05-03 ENCOUNTER — Telehealth (HOSPITAL_COMMUNITY): Payer: Self-pay | Admitting: *Deleted

## 2022-05-03 NOTE — Telephone Encounter (Signed)
Reaching out to patient to offer assistance regarding upcoming cardiac imaging study; pt verbalizes understanding of appt date/time, parking situation and where to check in, and verified current allergies; name and call back number provided for further questions should they arise  Gordy Clement RN Navigator Cardiac Imaging [facility] [phone] office [phone] cell  Patient states she had an MRI without incident.

## 2022-05-04 ENCOUNTER — Other Ambulatory Visit: Payer: Self-pay | Admitting: Cardiology

## 2022-05-04 ENCOUNTER — Ambulatory Visit (INDEPENDENT_AMBULATORY_CARE_PROVIDER_SITE_OTHER): Payer: No Typology Code available for payment source | Admitting: Allergy & Immunology

## 2022-05-04 ENCOUNTER — Ambulatory Visit (HOSPITAL_COMMUNITY)
Admission: RE | Admit: 2022-05-04 | Discharge: 2022-05-04 | Disposition: A | Discharge status: Home / Self Care | Payer: No Typology Code available for payment source | Source: Ambulatory Visit | Attending: Cardiology | Admitting: Cardiology

## 2022-05-04 ENCOUNTER — Other Ambulatory Visit: Payer: Self-pay

## 2022-05-04 ENCOUNTER — Encounter: Payer: Self-pay | Admitting: Allergy & Immunology

## 2022-05-04 VITALS — BP 138/72 | HR 68 | Temp 97.9°F | Resp 16 | Wt 183.9 lb

## 2022-05-04 DIAGNOSIS — I5032 Chronic diastolic (congestive) heart failure: Secondary | ICD-10-CM

## 2022-05-04 DIAGNOSIS — Q211 Atrial septal defect, unspecified: Secondary | ICD-10-CM

## 2022-05-04 DIAGNOSIS — J4530 Mild persistent asthma, uncomplicated: Secondary | ICD-10-CM | POA: Diagnosis not present

## 2022-05-04 DIAGNOSIS — J3089 Other allergic rhinitis: Secondary | ICD-10-CM

## 2022-05-04 MED ORDER — ALBUTEROL SULFATE HFA 108 (90 BASE) MCG/ACT IN AERS: INHALATION_SPRAY | RESPIRATORY_TRACT | 1 refills | Status: DC

## 2022-05-04 MED ORDER — GADOBUTROL 1 MMOL/ML IV SOLN
10.0000 mL | Freq: Once | INTRAVENOUS | Status: AC | PRN
  Administered 2022-05-04: 10 mL via INTRAVENOUS

## 2022-05-04 NOTE — Patient Instructions (Addendum)
1. Perennial allergic rhinitis - Continue with Flonase one spray per nostril daily.  - Continue with Zyrtec 10mg  daily. - Continue with Singulair 10mg  daily.  2. Mild persistent asthma, uncomplicated - Lung testing looked stable today.   - Daily controller medication(s): Singulair 10mg  - Rescue medications: albuterol 4 puffs every 4-6 hours as needed - Changes during respiratory infections or worsening symptoms: add Alvesco 55mcg 2 puffs twice daily for ONE TO TWO WEEKS. - Asthma control goals:  * Full participation in all desired activities (may need albuterol before activity) * Albuterol use two time or less a week on average (not counting use with activity) * Cough interfering with sleep two time or less a month * Oral steroids no more than once a year * No hospitalizations  3. Return in about 6 months (around 11/02/2022).    Please inform us of any Emergency Department visits, hospitalizations, or changes in symptoms. Call us before going to the ED for breathing or allergy symptoms since we might be able to fit you in for a sick visit. Feel free to contact us anytime with any questions, problems, or concerns.  It was a pleasure to see you again today!  Websites that have reliable patient information: 1. American Academy of Asthma, Allergy, and Immunology: www.aaaai.org 2. Food Allergy Research and Education (FARE): foodallergy.org 3. Mothers of Asthmatics: http://www.asthmacommunitynetwork.org 4. American College of Allergy, Asthma, and Immunology: www.acaai.org   COVID-19 Vaccine Information can be found at: ShippingScam.co.uk For questions related to vaccine distribution or appointments, please email [email or web address] or call [phone].   We realize that you might be concerned about having an allergic reaction to the COVID19 vaccines. To help with that concern, WE ARE OFFERING THE COVID19 VACCINES IN OUR OFFICE!  Ask the front desk for dates!     "Like" [place] on Facebook and Instagram for our latest updates!      A healthy democracy works best when [employer] participate! Make sure you are registered to vote! If you have moved or changed any of your contact information, you will need to get this updated before voting!  In some cases, you MAY be able to register to vote online: [email or web address]

## 2022-05-04 NOTE — Progress Notes (Signed)
FOLLOW UP  Date of Service/Encounter:  05/04/22   Assessment:   Mild persistent asthma, uncomplicated (with stable spirometry)    Perennial allergic rhinitis   Fully vaccinated [name] - with booster  Plan/Recommendations:   1. Perennial allergic rhinitis - Continue with Flonase one spray per nostril daily.  - Continue with Zyrtec 10mg  daily. - Continue with Singulair 10mg  daily.  2. Mild persistent asthma, uncomplicated - Lung testing looked stable today.   - Daily controller medication(s): Singulair 10mg  - Rescue medications: albuterol 4 puffs every 4-6 hours as needed - Changes during respiratory infections or worsening symptoms: add Alvesco 32mcg 2 puffs twice daily for ONE TO TWO WEEKS. - Asthma control goals:  * Full participation in all desired activities (may need albuterol before activity) * Albuterol use two time or less a week on average (not counting use with activity) * Cough interfering with sleep two time or less a month * Oral steroids no more than once a year * No hospitalizations  3. Return in about 6 months (around 11/02/2022).     Subjective:   Charlotte Chapman is a 52 y.o. 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  Charlotte Chapman is a 52 y.o. female  presenting for a follow up visit.  We last saw her in July 2023.  At that time, we stopped her current nose spray and started Ryaltris 1 spray per nostril twice daily.  We also continue with Zyrtec as well as Singulair.  For his asthma, we continue with the Singulair as well as butyryl as needed.  She also had an inhaled steroid that she added during flares.  Since last visit, she has done well.  She has a history of CHF. She had a routine echocardiogram She is going in for a cardiac MRI. She has that scheduled for today. She has never had one previously. It is scheduled from 3-4 pm. She is very nervous about this and attributes her blood pressure to the anxiety that she is experiencing. It apparently is not going to be sedated at all because she needs to follow directions with regards to breathing during the   Asthma/Respiratory Symptom History: Breathing has been pretty good. She had a sinus infection during Christmas because of her tooth.  She has not been using her rescue inhaler since Christmas time, but this decreased over the last week or so. She remains on the montelukast. She is not on any other inhaler. She denies coughing at night. She had steroids for her breathing during Christmas when she was on steroids and antibiotics. She does not think that she needs a daily inhaler.   Allergic Rhinitis Symptom History: She remains on the fluticasone.  She gets this over the counter. It is cheaper for her. She  is also on cetirizine daily. She has not been taking the prescription nose spray that we gave her last time. She has not been on antibiotics at all for her symptoms.    Otherwise, there have been no changes to her past medical history, surgical history, family history, or social history.    Review of Systems  Constitutional: Negative.  Negative for chills, fever, malaise/fatigue and weight loss.  HENT:  Negative for congestion, ear discharge, ear pain and sinus pain.   Eyes:  Negative for  pain, discharge and redness.  Respiratory:  Negative for cough, sputum production, shortness of breath and wheezing.   Cardiovascular: Negative.  Negative for chest pain and palpitations.  Gastrointestinal:  Negative for abdominal pain, constipation, diarrhea, heartburn, nausea and vomiting.  Skin: Negative.  Negative for itching and rash.  Neurological:  Negative for dizziness and headaches.  Endo/Heme/Allergies:  Negative for environmental allergies. Does not bruise/bleed easily.       Objective:   Blood pressure 138/72, pulse 68, temperature 97.9 F (36.6 C), temperature source Temporal, resp. rate 16, weight 183 lb 14.4 oz (83.4 kg), SpO2 95 %. Body mass index is 32.58 kg/m.    Physical Exam Vitals reviewed.  Constitutional:      Appearance: She is well-developed.     Comments: Pleasant.  Somewhat flat affect.  I did get her to smile a few times.  HENT:     Head: Normocephalic and atraumatic.     Right Ear: Tympanic membrane, ear canal and external ear normal.     Left Ear: Tympanic membrane, ear canal and external ear normal.     Nose: No nasal deformity, septal deviation, mucosal edema or rhinorrhea.     Right Turbinates: Enlarged, swollen and pale.     Left Turbinates: Enlarged, swollen and pale.     Right Sinus: No maxillary sinus tenderness or frontal sinus tenderness.     Left Sinus: No maxillary sinus tenderness or frontal sinus tenderness.     Mouth/Throat:     Lips: Pink.     Mouth: Mucous membranes are moist. Mucous membranes are not pale and not dry.     Pharynx: Uvula midline.  Eyes:     General: Lids are normal. Allergic shiner present.        Right eye: No discharge.        Left eye: No discharge.     Conjunctiva/sclera: Conjunctivae normal.     Right eye: Right conjunctiva is not injected. No chemosis.    Left eye: Left conjunctiva is not injected. No chemosis.    Pupils: Pupils are equal, round, and reactive to light.  Cardiovascular:     Rate and  Rhythm: Normal rate and regular rhythm.     Heart sounds: Normal heart sounds.  Pulmonary:     Effort: Pulmonary effort is normal. No tachypnea, accessory muscle usage or respiratory distress.     Breath sounds: Decreased breath sounds present. No wheezing, rhonchi or rales.     Comments: Decreased breath sounds in the bases that improves following Xopenex puffs.  Chest:     Chest wall: No tenderness.  Lymphadenopathy:     Cervical: No cervical adenopathy.  Skin:    General: Skin is warm.     Capillary Refill: Capillary refill takes less than 2 seconds.     Coloration: Skin is not pale.     Findings: No abrasion, erythema, petechiae or rash. Rash is not papular, urticarial or vesicular.     Comments:  No eczematous or urticarial lesions noted.  Neurological:     Mental Status: She is alert.  Psychiatric:        Behavior: Behavior is cooperative.      Diagnostic studies:    Spirometry: results normal (FEV1: 1.77/69%, FVC: 2.20/69%, FEV1/FVC: 80%).    Spirometry consistent with normal pattern. Overall this is stable compared to previous spirometric findings.   Allergy Studies: none        Malachi Bonds, MD  [facility]

## 2022-05-18 ENCOUNTER — Telehealth: Payer: Self-pay

## 2022-05-18 NOTE — Telephone Encounter (Signed)
Left message on voice mail per DPR for patient to call back for results. 

## 2022-05-18 NOTE — Telephone Encounter (Signed)
-----  Message from Nuala Alpha, LPN sent at 3/42/8768  8:07 AM EST -----  ----- Message ----- From: Sueanne Margarita, MD Sent: 05/11/2022   2:39 PM EST To: [facility] Triage  cMRI shows possible shunt at level of prior ASD repair, normal LVF, normal RVF with mildly enlarged RV and mild TR.  There is also possible left sided SVC -please get a cardiac CTA further evaluation of left SVC and ASD

## 2022-05-23 NOTE — Telephone Encounter (Signed)
Patient returned RN's call regarding results. 

## 2022-05-24 ENCOUNTER — Other Ambulatory Visit: Payer: Self-pay | Admitting: Allergy & Immunology

## 2022-05-24 NOTE — Telephone Encounter (Signed)
Spoke with patient regarding Dr. Theodosia Blender recommendations for a cardiac cta. Patient verbalized understanding of the concern for left SVC vasculature differences, tricuspid regurgitation and possible changes around site of previous ASD repair. Patient states she cannot afford addition medical procedures and imaging right now and would like some time to think about it. Patient very concerned about cost of imaging and risks associated with possible conditions found on imaging. Provided education as to symptoms that may be related to these conditions, patient states she needs additional time to research these conditions. Made an appointment for patient to see Dr. Radford Pax 07/07/22 when she may be able to get her questions answered and schedule procedure.

## 2022-06-11 ENCOUNTER — Encounter: Payer: Self-pay | Admitting: Cardiology

## 2022-06-11 DIAGNOSIS — Q211 Atrial septal defect, unspecified: Secondary | ICD-10-CM

## 2022-06-11 DIAGNOSIS — I5032 Chronic diastolic (congestive) heart failure: Secondary | ICD-10-CM

## 2022-06-15 ENCOUNTER — Telehealth: Payer: Self-pay | Admitting: *Deleted

## 2022-06-15 NOTE — Telephone Encounter (Signed)
A message was left [id number] her CT Chest.

## 2022-07-07 ENCOUNTER — Ambulatory Visit: Payer: No Typology Code available for payment source | Admitting: Cardiology

## 2022-07-19 ENCOUNTER — Ambulatory Visit (HOSPITAL_COMMUNITY)
Admission: RE | Admit: 2022-07-19 | Discharge: 2022-07-19 | Disposition: A | Discharge status: Home / Self Care | Payer: 59 | Source: Ambulatory Visit | Attending: Cardiology | Admitting: Cardiology

## 2022-07-19 DIAGNOSIS — Q211 Atrial septal defect, unspecified: Secondary | ICD-10-CM | POA: Insufficient documentation

## 2022-07-19 DIAGNOSIS — I517 Cardiomegaly: Secondary | ICD-10-CM | POA: Diagnosis not present

## 2022-07-19 DIAGNOSIS — I5032 Chronic diastolic (congestive) heart failure: Secondary | ICD-10-CM

## 2022-07-19 MED ORDER — IOHEXOL 350 MG/ML SOLN
75.0000 mL | Freq: Once | INTRAVENOUS | Status: AC | PRN
  Administered 2022-07-19: 75 mL via INTRAVENOUS

## 2022-07-21 ENCOUNTER — Telehealth: Payer: Self-pay | Admitting: Cardiology

## 2022-07-21 ENCOUNTER — Encounter: Payer: Self-pay | Admitting: Cardiology

## 2022-07-21 DIAGNOSIS — G4733 Obstructive sleep apnea (adult) (pediatric): Secondary | ICD-10-CM

## 2022-07-21 DIAGNOSIS — Q249 Congenital malformation of heart, unspecified: Secondary | ICD-10-CM | POA: Insufficient documentation

## 2022-07-21 DIAGNOSIS — I517 Cardiomegaly: Secondary | ICD-10-CM

## 2022-07-21 NOTE — Telephone Encounter (Signed)
Patient returned RN's call. 

## 2022-07-21 NOTE — Telephone Encounter (Signed)
Spoke w patient.  Reviewed results and recommendations.  Pt had no questions at this time. Orders placed for VQ scan, PFTs and ITamar Study.

## 2022-07-21 NOTE — Telephone Encounter (Signed)
-----   Message from Quintella Reichert, MD sent at 07/21/2022  1:41 PM EDT ----- CHest CT showed no evidence of flow across a residual ASD.  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  One of the SVC's flow normally into the right atrium and the other one flows into the left atrium which is abnormal but of no concern at this time.  Her O2 sats have been normal.  Unclear etiology of her RV enlargement. I would like her to have an Itamar sleep study to rule out OSA as well as a VQ scan to rule out chronic thromboembolic events and PFTs with DLCO

## 2022-07-24 ENCOUNTER — Telehealth: Payer: Self-pay | Admitting: *Deleted

## 2022-07-24 NOTE — Telephone Encounter (Signed)
Pt will come by the office tomorrow 4/9 3 pm for Itamar set up. Stop bang needs to be done still.

## 2022-07-24 NOTE — Telephone Encounter (Signed)
-----   Message from Lendon Ka, RN sent at 07/21/2022  5:37 PM EDT ----- Regarding: Donnie Coffin Sleep Study Itamar Sleep Study order from Dr. Mayford Knife.  I only placed the order and told the patient she would contacted.  Not sure if there was anything else we need to do.  Thank you Michalene

## 2022-07-24 NOTE — Telephone Encounter (Signed)
Pt is returning Carol's call regarding Itamar study.

## 2022-07-24 NOTE — Telephone Encounter (Signed)
I left a message for the Charlotte Chapman to call me back in regard to message I received Dr. Mayford Knife wants Charlotte Chapman to be set up for Itamar study.

## 2022-07-25 NOTE — Telephone Encounter (Signed)
Sleep Apnea Evaluation  [facility]  Today's Date: 07/25/2022   Patient Name: Charlotte Chapman        DOB: 03/08/1971       Height:        Weight:    BMI: There is no height or weight on file to calculate BMI.    Referring Provider:     STOP-BANG RISK ASSESSMENT         If STOP-BANG Score ?3 OR two clinical symptoms - patient qualifies for WatchPAT (CPT 95800)      Sleep study ordered due to two (2) of the following clinical symptoms/diagnoses:  Excessive daytime sleepiness G47.10  Gastroesophageal reflux K21.9  Nocturia R35.1  Morning Headaches G44.221  Difficulty concentrating R41.840  Memory problems or poor judgment G31.84  Personality changes or irritability R45.4  Loud snoring R06.83  Depression F32.9  Unrefreshed by sleep G47.8  Impotence N52.9  History of high blood pressure R03.0  Insomnia G47.00  Sleep Disordered Breathing or Sleep Apnea ICD G47.33

## 2022-07-25 NOTE — Telephone Encounter (Signed)
Pt came by the office and has been set up for Itamar study ordered by Dr. Mayford Knife. Pt agreeable to signed waiver and to not open the box. Once approved will call the pt with PIN#.

## 2022-08-02 NOTE — Telephone Encounter (Signed)
Prior Authorization for ITAMAR sent to [employer] via web portal. Tracking Number .  READY-NO PA REQ-   

## 2022-08-03 NOTE — Telephone Encounter (Signed)
I s/w the pt and she was given PIN# [id number]. Pt asked does she need to do Itamar study with her cpap. I reviewed with Dr. Mayford Knife. I called the pt back and left her a vm that per Dr. Mayford Knife to cancel the Itamar device as pt has OSA and on CPAP. I left message for pt that she will need to return device and if she can return either today or tomorrow. Once I have device returned to me I will un-register the device from the pt's name. I left message for pt to call me and let me know if she cannot return device either today or tomorrow and if she can then one day next week.

## 2022-08-03 NOTE — Telephone Encounter (Signed)
Pt returned the [place] device today. I will unregister and place back into stock.

## 2022-08-07 DIAGNOSIS — R946 Abnormal results of thyroid function studies: Secondary | ICD-10-CM | POA: Diagnosis not present

## 2022-08-07 DIAGNOSIS — R5383 Other fatigue: Secondary | ICD-10-CM | POA: Diagnosis not present

## 2022-08-21 NOTE — Telephone Encounter (Signed)
Charlotte Chapman, see notes below. Dt. Mayford Knife canceled the itamar, as pt is already on CPAP.     You2 weeks ago    I s/w the pt and she was given PIN# [id number]. Pt asked does she need to do Itamar study with her cpap. I reviewed with Dr. Mayford Knife. I called the pt back and left her a vm that per Dr. Mayford Knife to cancel the Itamar device as pt has OSA and on CPAP. I left message for pt that she will need to return device and if she can return either today or tomorrow. Once I have device returned to me I will un-register the device from the pt's name. I left message for pt to call me and let me know if she cannot return device either today or tomorrow and if she can then one day next week.

## 2022-08-21 NOTE — Telephone Encounter (Signed)
Prior Authorization for [employer] sent to [employer] via web portal. Tracking Number . READY-NO PA REQ

## 2022-08-25 ENCOUNTER — Encounter: Payer: Self-pay | Admitting: Cardiology

## 2022-08-25 ENCOUNTER — Ambulatory Visit: Payer: 59 | Attending: Cardiology | Admitting: Cardiology

## 2022-08-25 VITALS — BP 143/80 | HR 63 | Ht 63.0 in | Wt 191.4 lb

## 2022-08-25 DIAGNOSIS — G4733 Obstructive sleep apnea (adult) (pediatric): Secondary | ICD-10-CM

## 2022-08-25 DIAGNOSIS — R0609 Other forms of dyspnea: Secondary | ICD-10-CM

## 2022-08-25 DIAGNOSIS — I5032 Chronic diastolic (congestive) heart failure: Secondary | ICD-10-CM

## 2022-08-25 DIAGNOSIS — G2581 Restless legs syndrome: Secondary | ICD-10-CM

## 2022-08-25 DIAGNOSIS — Q211 Atrial septal defect, unspecified: Secondary | ICD-10-CM

## 2022-08-25 MED ORDER — ROPINIROLE HCL 1 MG PO TABS: 1.0000 mg | ORAL_TABLET | Freq: Every day | ORAL | 3 refills | Status: AC

## 2022-08-25 NOTE — Progress Notes (Signed)
Date:  08/25/2022   ID:  Charlotte Chapman, DOB 1970-11-18, MRN [id number]  PCP:  Sigmund Hazel, MD  Cardiologist:  Armanda Magic, MD Electrophysiologist:  None   Chief Complaint:  CHF, ASD, OSA  History of Present Illness:    Charlotte Chapman is a 52 y.o. female with a hx of ASD s/p repair, chronic diastolic CHF and OSA on CPAP.  She is here today for followup.  She tells me that recently she has been having tightness in her chest but no radiation and no associated sx of nausea or diaphoresis.  She also have been having some SOB recently but mainly with increased anxiety or stress.  She denies any PND, orthopnea, LE edema, dizziness, palpitations (except when she gets anxious) or syncope. She is compliant with her meds and is tolerating meds with no SE.    She is doing well with her PAP device and thinks that she has gotten used to it.  She tolerates the mask and feels the pressure is adequate.  Recently she has been having more fatigue and has not been sleeping well.  She goes to bed at 10pm and gets up around 9:30am. She wakes up 1-3 times nightly.  She denies any significant mouth or nasal dryness or nasal congestion.  She does not think that he snores.   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 by CTA 4/2024cava, with the left-sided SVC emptying into the left atrium.   DVT (deep venous thrombosis) ([facility])    Heart murmur    Obesity    OSA (obstructive sleep apnea)    RLS (restless legs syndrome)    Low ferritin 6/12 10/13   Social anxiety disorder     Past Surgical History:  Procedure Laterality Date   CARDIAC SURGERY     TONSILLECTOMY     TYMPANOSTOMY TUBE PLACEMENT       Current Meds  Medication Sig   acetaminophen (TYLENOL) 325 MG tablet Take 650 mg by mouth every 6 (six) hours as needed for moderate pain.   albuterol (VENTOLIN HFA) 108 (90 Base) MCG/ACT inhaler TAKE 2 PUFFS BY MOUTH EVERY 6 HOURS AS NEEDED FOR WHEEZE OR SHORTNESS OF BREATH   Calcium-Magnesium-Vitamin D (CALCIUM MAGNESIUM PO) Take 1 tablet by mouth daily.   cetirizine (ZYRTEC) 10 MG tablet Take 10 mg by mouth 2 (two) times daily.   citalopram (CELEXA) 40 MG tablet Take 40 mg by mouth daily.   fluticasone (FLONASE) 50 MCG/ACT nasal spray Place 2 sprays into both nostrils daily.    furosemide (LASIX) 20 MG tablet Take 1 tablet (20 mg total) by mouth daily.   guaiFENesin (MUCINEX) 600 MG 12 hr tablet Take 600 mg by mouth 2 (two) times daily as needed.   hydrOXYzine (ATARAX) 25 MG tablet Take 50 mg by mouth at bedtime as needed.   IRON PO Take 1 tablet by mouth every other day.   L-Lysine 1000 MG TABS Take 1,000 mg by mouth as needed.  montelukast (SINGULAIR) 10 MG tablet TAKE 1 TABLET BY MOUTH EVERY DAY   Multiple Vitamin (MULTIVITAMIN) tablet Take 1 tablet by mouth daily.   potassium chloride SA (KLOR-CON M20) 20 MEQ tablet Take 1 tablet (20 mEq total) by mouth daily.   rOPINIRole (REQUIP) 1 MG tablet TAKE 1 TABLET BY MOUTH AT BEDTIME.     Allergies:   Cat hair extract, Dust mite extract, Tree extract, and Duraflex   Social History   Tobacco Use   Smoking status: Never   Smokeless tobacco: Never  Vaping Use   Vaping Use: Never used  Substance Use Topics   Alcohol use: Yes    Alcohol/week: 0.0 standard drinks of alcohol    Comment: Occasionally.   Drug use: No     Family Hx: The patient's family history includes Asthma in her sister; Autoimmune disease in her mother; CAD in her father; Cancer in her father; Cancer - Prostate in her father; Diabetes  in her mother; Heart disease in her father; Hypertension in her father.  ROS:   Please see the history of present illness.     All other systems reviewed and are negative.   Labs/Other Tests and Data Reviewed:    Recent Labs: 09/07/2021: ALT 16; BUN 11; Creatinine, Ser 0.78; Hemoglobin 14.8; Platelets 217; Potassium 3.5; Sodium 138   Recent Lipid Panel No results found for: "CHOL", "TRIG", "HDL", "CHOLHDL", "LDLCALC", "LDLDIRECT"  Wt Readings from Last 3 Encounters:  08/25/22 191 lb 6.4 oz (86.8 kg)  05/04/22 183 lb 14.4 oz (83.4 kg)  01/20/22 183 lb (83 kg)     Objective:    Vital Signs:  BP (!) 143/80 (BP Location: Left Arm, Patient Position: Sitting)   Pulse 63   Ht 5\' 3"  (1.6 m)   Wt 191 lb 6.4 oz (86.8 kg)   SpO2 98%   BMI 33.90 kg/m    GEN: Well nourished, well developed in no acute distress HEENT: Normal NECK: No JVD; No carotid bruits LYMPHATICS: No lymphadenopathy CARDIAC:RRR, no murmurs, rubs, gallops RESPIRATORY:  Clear to auscultation without rales, wheezing or rhonchi  ABDOMEN: Soft, non-tender, non-distended MUSCULOSKELETAL:  No edema; No deformity  SKIN: Warm and dry NEUROLOGIC:  Alert and oriented x 3 PSYCHIATRIC:  Normal affect   EKG was not performed   ASSESSMENT & PLAN:    1. OSA - The patient is tolerating PAP therapy well without any problems. The PAP download performed by his DME was personally reviewed and interpreted by me today and showed an AHI of 1.4/hr on 12 cm H2O with 100% compliance in using more than 4 hours nightly.  The patient has been using and benefiting from PAP use and will continue to benefit from therapy.    2.  ASD -s/p repair -2D echo in 2018 with no residual shunt -2D echo in 01/2022 demonstrated no residual shunting and EF 55 to 60% with normal PA pressures and mild to moderate TR with dilated RV  3.  Chronic diastolic CHF -recently has had more SOB -she appears euvolemic on exam today -I have personally reviewed  and interpreted outside labs performed by patient's PCP which showed serum creatinine 0.7 and potassium 4.5 on 08/07/2022 -Continue prescription drug management with Lasix 20 mg daily with as needed refills  4  Restless Leg syndrome -this is well controlled on Requip -Continue prescription drug management with Requip 1mg  qhs>>refilled for 1 year   5.  DOE -unclear etiology -normal LVF on echo last fall -Hbg normal 5/23 and  mildly elevated TSH 08/07/2022 -Chest CTA showed no PE -cMRI 2024 with EF 60%, normal RVF with mild RVE and mild TR>>possible left to right shunting across prior ASD repair -coronary CTA to rule out CAD and assess for possible shunting across prior ASD repair (MRI suggestive of shunt)   Medication Adjustments/Labs and Tests Ordered: Current medicines are reviewed at length with the patient today.  Concerns regarding medicines are outlined above.  Tests Ordered: Orders Placed This Encounter  Procedures   EKG 12-Lead    Medication Changes: No orders of the defined types were placed in this encounter.    Disposition:  Follow up in 1 year(s)  Signed, Armanda Magic, MD  08/25/2022 2:42 PM    [facility]

## 2022-08-25 NOTE — Patient Instructions (Signed)
Medication Instructions:  Your physician recommends that you continue on your current medications as directed. Please refer to the Current Medication list given to you today.  *If you need a refill on your cardiac medications before your next appointment, please call your pharmacy*   Lab Work: None.  If you have labs (blood work) drawn today and your tests are completely normal, you will receive your results only by: MyChart Message (if you have MyChart) OR A paper copy in the mail If you have any lab test that is abnormal or we need to change your treatment, we will call you to review the results.   Testing/Procedures: None.   Follow-Up:   Your next appointment:   1 year(s)  Provider:   Traci Turner, MD     

## 2022-09-06 ENCOUNTER — Telehealth: Payer: Self-pay

## 2022-09-06 DIAGNOSIS — Q211 Atrial septal defect, unspecified: Secondary | ICD-10-CM

## 2022-09-06 DIAGNOSIS — Z01812 Encounter for preprocedural laboratory examination: Secondary | ICD-10-CM

## 2022-09-06 MED ORDER — METOPROLOL TARTRATE 50 MG PO TABS
50.0000 mg | ORAL_TABLET | Freq: Once | ORAL | 0 refills | Status: DC
Start: 2022-09-06 — End: 2022-09-06

## 2022-09-06 MED ORDER — METOPROLOL TARTRATE 50 MG PO TABS
50.0000 mg | ORAL_TABLET | Freq: Once | ORAL | 0 refills | Status: DC
Start: 2022-09-06 — End: 2023-02-06

## 2022-09-06 NOTE — Telephone Encounter (Signed)
Left detailed message per DPR asking patient to call the office.

## 2022-09-06 NOTE — Telephone Encounter (Signed)
-----   Message from Quintella Reichert, MD sent at 09/01/2022  8:56 AM EDT ----- PLease let patient know that I want to get a coronary CTA due to SOB to evaluate for CAD as well as assess prior ASD repair as MRI showed possible shunting of blood across prior ASD repair and since she is having SOB it needs more evaluation

## 2022-09-06 NOTE — Addendum Note (Signed)
Addended by: Luellen Pucker on: 09/06/2022 05:17 PM   Modules accepted: Orders

## 2022-09-06 NOTE — Telephone Encounter (Signed)
Called patient to discuss Dr. Norris Cross recommendation for coronary CTA. Patient verbalizes understanding of need to assess prior ASD repair. Test ordered, labs scheduled. Instructions reviewed verbally and sent via MyChart.

## 2022-09-06 NOTE — Telephone Encounter (Signed)
-----   Message from Traci R Turner, MD sent at 09/01/2022  8:56 AM EDT ----- PLease let patient know that I want to get a coronary CTA due to SOB to evaluate for CAD as well as assess prior ASD repair as MRI showed possible shunting of blood across prior ASD repair and since she is having SOB it needs more evaluation  

## 2022-09-07 ENCOUNTER — Ambulatory Visit: Payer: Commercial Managed Care - HMO | Attending: Cardiology

## 2022-09-07 DIAGNOSIS — Z01812 Encounter for preprocedural laboratory examination: Secondary | ICD-10-CM

## 2022-09-08 ENCOUNTER — Telehealth: Payer: Self-pay

## 2022-09-08 LAB — BASIC METABOLIC PANEL
BUN/Creatinine Ratio: 17 (ref 9–23)
BUN: 14 mg/dL (ref 6–24)
CO2: 22 mmol/L (ref 20–29)
Calcium: 9.5 mg/dL (ref 8.7–10.2)
Chloride: 106 mmol/L (ref 96–106)
Creatinine, Ser: 0.83 mg/dL (ref 0.57–1.00)
Glucose: 97 mg/dL (ref 70–99)
Potassium: 4.5 mmol/L (ref 3.5–5.2)
Sodium: 143 mmol/L (ref 134–144)
eGFR: 86 mL/min/{1.73_m2} (ref >59)

## 2022-09-08 NOTE — Telephone Encounter (Signed)
-----   Message from Heather E Pemberton, MD sent at 09/08/2022  9:01 AM EDT ----- Kidney function and electrolytes look great 

## 2022-09-08 NOTE — Telephone Encounter (Signed)
Called to advise patient that kidney function and electrolytes are normal, patient verbalizes understanding.

## 2022-09-27 ENCOUNTER — Telehealth (HOSPITAL_COMMUNITY): Payer: Self-pay | Admitting: Emergency Medicine

## 2022-09-27 NOTE — Telephone Encounter (Signed)
Reaching out to patient to offer assistance regarding upcoming cardiac imaging study; pt verbalizes understanding of appt date/time, parking situation and where to check in, pre-test NPO status and medications ordered, and verified current allergies; name and call back number provided for further questions should they arise Demauri Advincula RN Navigator Cardiac Imaging [facility] [phone] office [phone] cell 

## 2022-09-28 ENCOUNTER — Ambulatory Visit (HOSPITAL_COMMUNITY)
Admission: RE | Admit: 2022-09-28 | Discharge: 2022-09-28 | Disposition: A | Discharge status: Home / Self Care | Payer: Commercial Managed Care - HMO | Source: Ambulatory Visit | Attending: Cardiology | Admitting: Cardiology

## 2022-09-28 DIAGNOSIS — I5032 Chronic diastolic (congestive) heart failure: Secondary | ICD-10-CM | POA: Diagnosis not present

## 2022-09-28 DIAGNOSIS — Q211 Atrial septal defect, unspecified: Secondary | ICD-10-CM

## 2022-09-28 MED ORDER — NITROGLYCERIN 0.4 MG SL SUBL
SUBLINGUAL_TABLET | SUBLINGUAL | Status: AC
  Filled 2022-09-28: qty 2

## 2022-09-28 MED ORDER — NITROGLYCERIN 0.4 MG SL SUBL
0.8000 mg | SUBLINGUAL_TABLET | SUBLINGUAL | Status: DC | PRN
  Administered 2022-09-28: 0.8 mg via SUBLINGUAL

## 2022-09-28 MED ORDER — IOHEXOL 350 MG/ML SOLN
95.0000 mL | Freq: Once | INTRAVENOUS | Status: AC | PRN
  Administered 2022-09-28: 95 mL via INTRAVENOUS

## 2022-10-01 ENCOUNTER — Encounter: Payer: Self-pay | Admitting: Cardiology

## 2022-10-01 DIAGNOSIS — Q261 Persistent left superior vena cava: Secondary | ICD-10-CM | POA: Insufficient documentation

## 2022-10-04 ENCOUNTER — Telehealth: Payer: Self-pay

## 2022-10-04 DIAGNOSIS — Q211 Atrial septal defect, unspecified: Secondary | ICD-10-CM

## 2022-10-04 NOTE — Telephone Encounter (Signed)
Called patient to discuss CT results. Patient verbalizes understanding of left sided SVC that drains into left atrium, also states she is familiar with Dr. Deatra James who runs [facility] and agrees to referral.   Also advised patient to  never let an IV be placed in her arm due to the risk of air embolus. Patient verbalizes understanding.

## 2022-10-04 NOTE — Telephone Encounter (Signed)
-----   Message from Quintella Reichert, MD sent at 10/01/2022  4:20 PM EDT ----- Please find out if patient has any operative notes or knows where her ASD repair was done and if yes then try to get all records from that ----- Message ----- From: Wendall Stade, MD Sent: 09/28/2022   3:51 PM EDT To: Laurey Morale, MD; Quintella Reichert, MD; #  Would be useful for [place] to review this patient of Dr Mayford Knife. Cardiac CT done for chest pain and history of ASD repair at age 52. No operative note. 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 The coronary sinus appears absent This triad has been described rarely in literature. There is no mention of prior baffle/shunt of left sided SVC to RA and scan not carried up high enough to evaluate any anomalous venous connections Thayer Ohm identified the left SVC on prior MRI. Traci I would refer her to adult congenital clinic at [facility] and try to get her original operative notes. Let me know if you guys have any other thoughts

## 2022-10-04 NOTE — Telephone Encounter (Signed)
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  Also tell patient that she is to NEVER have an IV placed in her left arm as it may results in a stroke from microbubbles in an IV going straight to the left side of her heart called an air embolism ----- Message ----- From: Wendall Stade, MD Sent: 09/28/2022   3:51 PM EDT To: Laurey Morale, MD; Quintella Reichert, MD; #  Would be useful for [place] to review this patient of Dr Mayford Knife. Cardiac CT done for chest pain and history of ASD repair at age 52. No operative note. 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 The coronary sinus appears absent This triad has been described rarely in literature. There is no mention of prior baffle/shunt of left sided SVC to RA and scan not carried up high enough to evaluate any anomalous venous connections Thayer Ohm identified the left SVC on prior MRI. Traci I would refer her to adult congenital clinic at [facility] and try to get her original operative notes. Let me know if you guys have any other thoughts

## 2022-10-04 NOTE — Telephone Encounter (Signed)
Left detailed message per DPR asking patient to call our office.

## 2022-10-05 ENCOUNTER — Telehealth: Payer: Self-pay

## 2022-10-05 NOTE — Telephone Encounter (Signed)
-----   Message from Quintella Reichert, MD sent at 10/05/2022  7:42 AM EDT ----- Normal non cardiac portion of chest CT

## 2022-10-05 NOTE — Telephone Encounter (Signed)
Reviewed with patient normal noncardiac portion of test, patient verbalizes understanding.

## 2022-10-17 ENCOUNTER — Telehealth: Payer: Self-pay

## 2022-10-17 NOTE — Telephone Encounter (Signed)
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  Also tell patient that she is to NEVER have an IV placed in her left arm as it may results in a stroke from microbubbles in an IV going straight to the left side of her heart called an air embolism ----- Message ----- From: Nishan, Peter C, MD Sent: 09/28/2022   3:51 PM EDT To: Dalton S McLean, MD; Traci R Turner, MD; #  Would be useful for us to review this patient of Dr Turner. Cardiac CT done for chest pain and history of ASD repair at age 3. No operative note. 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 The coronary sinus appears absent This triad has been described rarely in literature. There is no mention of prior baffle/shunt of left sided SVC to RA and scan not carried up high enough to evaluate any anomalous venous connections Chris identified the left SVC on prior MRI. Traci I would refer her to adult congenital clinic at [facility] and try to get her original operative notes. Let me know if you guys have any other thoughts    

## 2022-10-17 NOTE — Telephone Encounter (Signed)
Spoke to patient and to Josh at [facility], patient  has appt 03/29/23 at 2 pm.

## 2022-11-13 ENCOUNTER — Other Ambulatory Visit: Payer: Self-pay | Admitting: Allergy & Immunology

## 2022-12-11 ENCOUNTER — Other Ambulatory Visit: Payer: Self-pay | Admitting: Allergy & Immunology

## 2022-12-11 ENCOUNTER — Other Ambulatory Visit: Payer: Self-pay | Admitting: Physician Assistant

## 2022-12-15 ENCOUNTER — Telehealth: Payer: Self-pay | Admitting: Internal Medicine

## 2022-12-15 ENCOUNTER — Ambulatory Visit: Payer: Commercial Managed Care - HMO | Admitting: Internal Medicine

## 2022-12-15 ENCOUNTER — Other Ambulatory Visit: Payer: Self-pay

## 2022-12-15 ENCOUNTER — Encounter: Payer: Self-pay | Admitting: Internal Medicine

## 2022-12-15 VITALS — BP 118/90 | HR 56 | Temp 98.2°F | Ht 63.0 in | Wt 192.6 lb

## 2022-12-15 DIAGNOSIS — J3089 Other allergic rhinitis: Secondary | ICD-10-CM

## 2022-12-15 DIAGNOSIS — J4530 Mild persistent asthma, uncomplicated: Secondary | ICD-10-CM

## 2022-12-15 MED ORDER — QVAR REDIHALER 40 MCG/ACT IN AERB
2.0000 | INHALATION_SPRAY | Freq: Two times a day (BID) | RESPIRATORY_TRACT | 5 refills | Status: DC
Start: 2022-12-15 — End: 2023-02-06

## 2022-12-15 MED ORDER — MONTELUKAST SODIUM 10 MG PO TABS: 10.0000 mg | ORAL_TABLET | Freq: Every day | ORAL | 0 refills | Status: DC

## 2022-12-15 MED ORDER — DULERA 100-5 MCG/ACT IN AERO: 1.0000 | INHALATION_SPRAY | Freq: Two times a day (BID) | RESPIRATORY_TRACT | 5 refills | Status: DC

## 2022-12-15 MED ORDER — ALBUTEROL SULFATE HFA 108 (90 BASE) MCG/ACT IN AERS: INHALATION_SPRAY | RESPIRATORY_TRACT | 1 refills | Status: DC

## 2022-12-15 NOTE — Patient Instructions (Addendum)
1. Perennial allergic rhinitis - Continue with Flonase one spray per nostril daily.  - Continue with Zyrtec 10mg  daily. - Continue with Singulair 10mg  daily.  2. Mild persistent asthma,: not well controlled  - Lung testing showed some inflammation in your lungs that did not reverse with albuterol - Daily controller medication(s): Singulair 10mg , Qvar 2 puffs twice daily ( rinse mouth out after use)  - Rescue medications: albuterol 4 puffs every 4-6 hours as needed - Changes during respiratory infections or worsening symptoms: Increase Qvar 4 puffs twice daily for ONE TO TWO WEEKS. - Asthma control goals:  * Full participation in all desired activities (may need albuterol before activity) * Albuterol use two time or less a week on average (not counting use with activity) * Cough interfering with sleep two time or less a month * Oral steroids no more than once a year * No hospitalizations  Follow up: 2 months   Thank you so much for letting me partake in your care today.  Don't hesitate to reach out if you have any additional concerns!  Ferol Luz, MD  [facility] [place] [place]

## 2022-12-15 NOTE — Progress Notes (Signed)
Follow Up Note  RE: Charlotte Chapman MRN: [id number] DOB: [birth year] Date of Office Visit: 12/15/2022  Referring provider: Sigmund Hazel, MD Primary care provider: Sigmund Hazel, MD  Chief Complaint: Follow-up, Medication Refill, Cough, Wheezing, Asthma, and Nasal Congestion  History of Present Illness: I had the pleasure of seeing Charlotte Chapman for a follow up visit at the [facility] on 12/15/2022. She is a 52 y.o. female, who is being followed for allergic rhinitis, persistent asthma. Her previous allergy office visit was on 05/04/22 with Dr. Dellis Anes. Today is a regular follow up visit.  History obtained from patient, chart review.  ASTHMA - Medical therapy: singulair 10mg   - Rescue inhaler use: 1-2 times a day  - Symptoms: cough, wheeze, chest tightness worsened since she had covid in July  - Exacerbation history: 1 (sinus infection post covid)  ABX for respiratory illness since last visit, 0 OCS, 0ED, 0 UC visits in the past year  - ACT: 15 /25 - Adverse effects of medication: denies  - Previous FEV1: 1.77 L, 68% - Biologic Labs not done   Allergic  Rhinitis: current therapy: Flonase, Singulair, Zyrtec,  symptoms partially improved still with postnasal drip since her sinus infection after COVID in July symptoms include: rhinorrhea and post nasal drainage Previous allergy testing: yes History of reflux/heartburn: no Interested in Allergy Immunotherapy: no   Assessment and Plan: Charlotte Chapman is a 52 y.o. female with: Not well controlled mild persistent asthma - Plan: Spirometry with Graph  Perennial allergic rhinitis   Plan: Patient Instructions  1. Perennial allergic rhinitis - Continue with Flonase one spray per nostril daily.  - Continue with Zyrtec 10mg  daily. - Continue with Singulair 10mg  daily.  2. Mild persistent asthma,: not well controlled  - Lung testing showed some inflammation in your lungs that did not reverse with albuterol - Daily  controller medication(s): Singulair 10mg , Qvar 2 puffs twice daily ( rinse mouth out after use)  - Rescue medications: albuterol 4 puffs every 4-6 hours as needed - Changes during respiratory infections or worsening symptoms: Increase Qvar 4 puffs twice daily for ONE TO TWO WEEKS. - Asthma control goals:  * Full participation in all desired activities (may need albuterol before activity) * Albuterol use two time or less a week on average (not counting use with activity) * Cough interfering with sleep two time or less a month * Oral steroids no more than once a year * No hospitalizations  Follow up: 2 months   Thank you so much for letting me partake in your care today.  Don't hesitate to reach out if you have any additional concerns!  Ferol Luz, MD  [facility] [place], [place]       Meds ordered this encounter  Medications   montelukast (SINGULAIR) 10 MG tablet    Sig: Take 1 tablet (10 mg total) by mouth daily.    Dispense:  30 tablet    Refill:  0    Courtesy refill. Patient needs an OV for further refills.   albuterol (VENTOLIN HFA) 108 (90 Base) MCG/ACT inhaler    Sig: TAKE 2 PUFFS BY MOUTH EVERY 6 HOURS AS NEEDED FOR WHEEZE OR SHORTNESS OF BREATH    Dispense:  18 g    Refill:  1   beclomethasone (QVAR REDIHALER) 40 MCG/ACT inhaler    Sig: Inhale 2 puffs into the lungs 2 (two) times daily.    Dispense:  1 each  Refill:  5    Lab Orders  No laboratory test(s) ordered today   Diagnostics: Spirometry:  Tracings reviewed. Her effort: Good reproducible efforts. FVC: 2.10 L FEV1: 1.72 L, 67% predicted FEV1/FVC ratio: 82%  Interpretation: Spirometry consistent with mixed obstructive and restrictive disease. after 4 puffs of albuterol there is no significant postbronchodilator response Please see scanned spirometry results for details.    Medication List:  Current Outpatient Medications  Medication Sig Dispense Refill    acetaminophen (TYLENOL) 325 MG tablet Take 650 mg by mouth every 6 (six) hours as needed for moderate pain.     beclomethasone (QVAR REDIHALER) 40 MCG/ACT inhaler Inhale 2 puffs into the lungs 2 (two) times daily. 1 each 5   Calcium-Magnesium-Vitamin D (CALCIUM MAGNESIUM PO) Take 1 tablet by mouth daily.     cetirizine (ZYRTEC) 10 MG tablet Take 10 mg by mouth 2 (two) times daily.     citalopram (CELEXA) 40 MG tablet Take 40 mg by mouth daily.     fluticasone (FLONASE) 50 MCG/ACT nasal spray Place 2 sprays into both nostrils daily.      furosemide (LASIX) 20 MG tablet TAKE 1 TABLET BY MOUTH EVERY DAY 90 tablet 2   guaiFENesin (MUCINEX) 600 MG 12 hr tablet Take 600 mg by mouth 2 (two) times daily as needed.     hydrOXYzine (ATARAX) 25 MG tablet Take 50 mg by mouth at bedtime as needed.     IRON PO Take 1 tablet by mouth every other day.     L-Lysine 1000 MG TABS Take 1,000 mg by mouth as needed.     Multiple Vitamin (MULTIVITAMIN) tablet Take 1 tablet by mouth daily.     potassium chloride SA (KLOR-CON M20) 20 MEQ tablet Take 1 tablet (20 mEq total) by mouth daily. 90 tablet 3   rOPINIRole (REQUIP) 1 MG tablet Take 1 tablet (1 mg total) by mouth at bedtime. 90 tablet 3   albuterol (VENTOLIN HFA) 108 (90 Base) MCG/ACT inhaler TAKE 2 PUFFS BY MOUTH EVERY 6 HOURS AS NEEDED FOR WHEEZE OR SHORTNESS OF BREATH 18 g 1   metoprolol tartrate (LOPRESSOR) 50 MG tablet Take 1 tablet (50 mg total) by mouth once for 1 dose. Take 90-120 minutes prior to scan. 1 tablet 0   mometasone-formoterol (DULERA) 100-5 MCG/ACT AERO Inhale 1 puff into the lungs 2 (two) times daily. 1 each 5   montelukast (SINGULAIR) 10 MG tablet Take 1 tablet (10 mg total) by mouth daily. 30 tablet 0   No current facility-administered medications for this visit.   Allergies: Allergies  Allergen Reactions   Cat Hair Extract     Other reaction(s): wheezing   Dust Mite Extract Cough   Tree Extract Cough   Duraflex    I reviewed her  past medical history, social history, family history, and environmental history and no significant changes have been reported from her previous visit.  ROS: All others negative except as noted per HPI.   Objective: BP (!) 118/90   Pulse (!) 56   Temp 98.2 F (36.8 C)   Ht 5\' 3"  (1.6 m)   Wt 192 lb 9.6 oz (87.4 kg)   SpO2 95%   BMI 34.12 kg/m  Body mass index is 34.12 kg/m. General Appearance:  Alert, cooperative, no distress, appears stated age  Head:  Normocephalic, without obvious abnormality, atraumatic  Eyes:  Conjunctiva clear, EOM's intact  Nose: Nares normal,  normal nasal mucosa, no rhinorrhea, hypertrophic turbinates, no visible anterior polyps,  and septum midline  Throat: Lips, tongue normal; teeth and gums normal, normal posterior oropharynx  Neck: Supple, symmetrical  Lungs:   clear to auscultation bilaterally, Respirations unlabored, intermittent dry coughing  Heart:  regular rate and rhythm and no murmur, Appears well perfused  Extremities: No edema  Skin: Skin color, texture, turgor normal, no rashes or lesions on visualized portions of skin  Neurologic: No gross deficits   Previous notes and tests were reviewed. The plan was reviewed with the patient/family, and all questions/concerned were addressed.  It was my pleasure to see Charlotte Chapman today and participate in her care. Please feel free to contact me with any questions or concerns.  Sincerely,  Ferol Luz, MD  Allergy & Immunology  [facility] Office: [phone]

## 2022-12-15 NOTE — Telephone Encounter (Signed)
Patient attempted to pick up the Qvar from pharmacy. Patient was told it would be $200 for the Qvar, patient is requesting a cheaper alternative to be sent in.   Best contact number: [phone]

## 2022-12-15 NOTE — Telephone Encounter (Signed)
It appears dulera is a tier 1 and symbicort is tier 2 advise to change please n

## 2022-12-15 NOTE — Addendum Note (Signed)
Addended by: Berna Bue on: 12/15/2022 02:24 PM   Modules accepted: Orders

## 2022-12-15 NOTE — Telephone Encounter (Signed)
Lets do dulera 1 puff twice dialy

## 2022-12-15 NOTE — Telephone Encounter (Signed)
Sent in dulera to pts pharmacy

## 2022-12-19 ENCOUNTER — Encounter: Payer: Self-pay | Admitting: Internal Medicine

## 2022-12-20 ENCOUNTER — Other Ambulatory Visit (HOSPITAL_COMMUNITY): Payer: Self-pay

## 2022-12-20 NOTE — Telephone Encounter (Signed)
Did test claims on all other ICS class inhalers. Results are below. Please note that patient DOES have a deductible that needs to be met.  Pulmicort Flexhaler: Requires PA Fluticasone HFA: Requires PA Fluticasone-Salmeterol Diskus: $33.33 Asmanex: Requires PA Arnuity Ellipta: $195.77 Alvesco: $82.48

## 2022-12-20 NOTE — Telephone Encounter (Signed)
We can try advair diskus 1 puff twice daily.  Generic is fine.

## 2022-12-25 MED ORDER — FLUTICASONE-SALMETEROL 100-50 MCG/ACT IN AEPB: 1.0000 | INHALATION_SPRAY | Freq: Two times a day (BID) | RESPIRATORY_TRACT | 1 refills | Status: DC

## 2022-12-25 NOTE — Telephone Encounter (Signed)
I called and left a message for patient to call our office back to inform her of the inhaler medication change. I also sent patient a MyChart message with medication change and usage.

## 2022-12-25 NOTE — Telephone Encounter (Signed)
Patient called back and stated she was able to see mychart message. Has no further questions at this time.

## 2023-01-08 ENCOUNTER — Other Ambulatory Visit: Payer: Self-pay

## 2023-01-08 MED ORDER — MONTELUKAST SODIUM 10 MG PO TABS: 10.0000 mg | ORAL_TABLET | Freq: Every day | ORAL | 0 refills | Status: DC

## 2023-01-12 ENCOUNTER — Other Ambulatory Visit: Payer: Self-pay | Admitting: Cardiology

## 2023-01-12 MED ORDER — POTASSIUM CHLORIDE CRYS ER 20 MEQ PO TBCR: 20.0000 meq | EXTENDED_RELEASE_TABLET | Freq: Every day | ORAL | 2 refills | Status: AC

## 2023-01-25 ENCOUNTER — Other Ambulatory Visit: Payer: Self-pay

## 2023-01-25 ENCOUNTER — Emergency Department (HOSPITAL_COMMUNITY)
Admission: EM | Admit: 2023-01-25 | Discharge: 2023-01-26 | Disposition: A | Discharge status: Home / Self Care | Payer: Worker's Compensation | Attending: Emergency Medicine | Admitting: Emergency Medicine

## 2023-01-25 ENCOUNTER — Encounter (HOSPITAL_COMMUNITY): Payer: Self-pay

## 2023-01-25 DIAGNOSIS — S6991XA Unspecified injury of right wrist, hand and finger(s), initial encounter: Secondary | ICD-10-CM | POA: Diagnosis present

## 2023-01-25 DIAGNOSIS — W010XXA Fall on same level from slipping, tripping and stumbling without subsequent striking against object, initial encounter: Secondary | ICD-10-CM | POA: Insufficient documentation

## 2023-01-25 DIAGNOSIS — S52501A Unspecified fracture of the lower end of right radius, initial encounter for closed fracture: Secondary | ICD-10-CM | POA: Insufficient documentation

## 2023-01-25 DIAGNOSIS — W19XXXA Unspecified fall, initial encounter: Secondary | ICD-10-CM

## 2023-01-25 DIAGNOSIS — Y990 Civilian activity done for income or pay: Secondary | ICD-10-CM | POA: Diagnosis not present

## 2023-01-25 NOTE — ED Triage Notes (Signed)
Patient c/o right wrist pain after tripping over a pallet at work. Right wrist has obvious swelling. 8/10 pain at this time, distal sensation intact, minimal movement to fingers d/t pain. A&Ox4, no distress noted at this time.

## 2023-01-26 ENCOUNTER — Emergency Department (HOSPITAL_COMMUNITY): Payer: Commercial Managed Care - HMO

## 2023-01-26 ENCOUNTER — Emergency Department (HOSPITAL_COMMUNITY): Payer: Worker's Compensation

## 2023-01-26 MED ORDER — LIDOCAINE HCL (PF) 1 % IJ SOLN
30.0000 mL | Freq: Once | INTRAMUSCULAR | Status: AC
  Administered 2023-01-26: 30 mL
  Filled 2023-01-26: qty 30

## 2023-01-26 MED ORDER — OXYCODONE HCL 5 MG PO TABS
5.0000 mg | ORAL_TABLET | Freq: Once | ORAL | Status: AC
  Administered 2023-01-26: 5 mg via ORAL
  Filled 2023-01-26: qty 1

## 2023-01-26 MED ORDER — OXYCODONE HCL 5 MG PO TABS
5.0000 mg | ORAL_TABLET | ORAL | 0 refills | Status: DC | PRN
Start: 2023-01-26 — End: 2023-02-15

## 2023-01-26 NOTE — Discharge Instructions (Addendum)
Keep your splint on, clean, dry. You can apply ice over top of the splint and elevate the arm to help with pain and swelling. You can take Motrin and Tylenol as needed as directed. Take oxycodone for pain not controlled with Motrin and Tylenol. Do NOT drive or operate machinery while taking Oxycodone. This medicaiton can cause constipation. Take Miralax as needed.  Follow up with orthopedics, you will need to call and schedule an appointment. Return to the ER for worsening or concerning symptoms.

## 2023-01-26 NOTE — Progress Notes (Signed)
Orthopedic Tech Progress Note Patient Details:  Charlotte Chapman June 10, 1970 [id number]  Ortho Devices Type of Ortho Device: Ace wrap, Cotton web roll, Sugartong splint, Arm sling, Finger trap Finger Trap Weight: 4 Ortho Device/Splint Location: RUE Ortho Device/Splint Interventions: Ordered, Application   Post Interventions Patient Tolerated: Well, Fair Instructions Provided: Care of device  Donald Pore 01/26/2023, 4:31 AM

## 2023-01-26 NOTE — ED Notes (Signed)
Patient transported to X-ray 

## 2023-01-26 NOTE — ED Provider Notes (Signed)
[place] EMERGENCY DEPARTMENT AT [facility] Provider Note   CSN: [id number] Arrival date & time: 01/25/23  2201     History  Chief Complaint  Patient presents with   Wrist Pain    Charlotte Chapman is a 52 y.o. female.  52 year old female, right hand dominant, here with right wrist pain/injury after a mechanical fall at work landing on outstretched hand. No other injuries. No open wounds.        Home Medications Prior to Admission medications   Medication Sig Start Date End Date Taking? Authorizing Provider  oxyCODONE (ROXICODONE) 5 MG immediate release tablet Take 1 tablet (5 mg total) by mouth every 4 (four) hours as needed for severe pain. 01/26/23  Yes Jeannie Fend, PA-C  acetaminophen (TYLENOL) 325 MG tablet Take 650 mg by mouth every 6 (six) hours as needed for moderate pain.    [provider]  albuterol (VENTOLIN HFA) 108 (90 Base) MCG/ACT inhaler TAKE 2 PUFFS BY MOUTH EVERY 6 HOURS AS NEEDED FOR WHEEZE OR SHORTNESS OF BREATH 12/15/22   Ferol Luz, MD  beclomethasone (QVAR REDIHALER) 40 MCG/ACT inhaler Inhale 2 puffs into the lungs 2 (two) times daily. 12/15/22   Ferol Luz, MD  Calcium-Magnesium-Vitamin D (CALCIUM MAGNESIUM PO) Take 1 tablet by mouth daily.    [provider]  cetirizine (ZYRTEC) 10 MG tablet Take 10 mg by mouth 2 (two) times daily.    [provider]  citalopram (CELEXA) 40 MG tablet Take 40 mg by mouth daily. 08/19/22   [provider]  fluticasone (FLONASE) 50 MCG/ACT nasal spray Place 2 sprays into both nostrils daily.     [provider]  fluticasone-salmeterol (ADVAIR) 100-50 MCG/ACT AEPB Inhale 1 puff into the lungs 2 (two) times daily. 12/25/22   Ferol Luz, MD  furosemide (LASIX) 20 MG tablet TAKE 1 TABLET BY MOUTH EVERY DAY 12/12/22   Quintella Reichert, MD  guaiFENesin (MUCINEX) 600 MG 12 hr tablet Take 600 mg by mouth 2 (two) times daily as needed.    [provider]   hydrOXYzine (ATARAX) 25 MG tablet Take 50 mg by mouth at bedtime as needed. 08/18/22   [provider]  IRON PO Take 1 tablet by mouth every other day.    [provider]  L-Lysine 1000 MG TABS Take 1,000 mg by mouth as needed.    [provider]  metoprolol tartrate (LOPRESSOR) 50 MG tablet Take 1 tablet (50 mg total) by mouth once for 1 dose. Take 90-120 minutes prior to scan. 09/06/22 09/06/22  Quintella Reichert, MD  mometasone-formoterol (DULERA) 100-5 MCG/ACT AERO Inhale 1 puff into the lungs 2 (two) times daily. 12/15/22   Ferol Luz, MD  montelukast (SINGULAIR) 10 MG tablet Take 1 tablet (10 mg total) by mouth daily. 01/08/23   Ferol Luz, MD  Multiple Vitamin (MULTIVITAMIN) tablet Take 1 tablet by mouth daily.    [provider]  potassium chloride SA (KLOR-CON M20) 20 MEQ tablet Take 1 tablet (20 mEq total) by mouth daily. 01/12/23   Quintella Reichert, MD  rOPINIRole (REQUIP) 1 MG tablet Take 1 tablet (1 mg total) by mouth at bedtime. 08/25/22   Quintella Reichert, MD      Allergies    Cat hair extract, Dust mite extract, Tree extract, and Duraflex    Review of Systems   Review of Systems Negative except as per HPI Physical Exam Updated Vital Signs BP (!) 165/75 (BP Location: Right  Arm)   Pulse 75   Temp 98.5 F (36.9 C)   Resp 16   Ht 5\' 3"  (1.6 m)   Wt 86.2 kg   SpO2 99%   BMI 33.66 kg/m  Physical Exam Vitals and nursing note reviewed.  Constitutional:      General: She is not in acute distress.    Appearance: She is well-developed. She is not diaphoretic.  HENT:     Head: Normocephalic and atraumatic.  Cardiovascular:     Pulses: Normal pulses.  Pulmonary:     Effort: Pulmonary effort is normal.  Musculoskeletal:        General: Swelling, tenderness, deformity and signs of injury present.     Comments: +deformity to right wrist, skin intact, sensation and motor intact  Skin:    General: Skin is warm and dry.     Findings: No  erythema or rash.  Neurological:     Mental Status: She is alert and oriented to person, place, and time.     Sensory: No sensory deficit.  Psychiatric:        Behavior: Behavior normal.     ED Results / Procedures / Treatments   Labs (all labs ordered are listed, but only abnormal results are displayed) Labs Reviewed - No data to display  EKG None  Radiology DG Wrist Complete Right  Result Date: 01/26/2023 CLINICAL DATA:  Fall with right wrist pain and swelling. EXAM: RIGHT WRIST - COMPLETE 3+ VIEW COMPARISON:  None Available. FINDINGS: There is a comminuted transverse fracture of the distal radial metaphysis with dorsal angulation. The remaining bony structures are intact. There is no dislocation. Soft tissue swelling is present about the wrist. IMPRESSION: Comminuted transverse fracture of the distal radial metaphysis with dorsal angulation. Electronically Signed   By: Thornell Sartorius M.D.   On: 01/26/2023 01:02    Procedures .Nerve Block  Date/Time: 01/26/2023 4:02 AM  Performed by: Jeannie Fend, PA-C Authorized by: Jeannie Fend, PA-C   Consent:    Consent obtained:  Verbal   Consent given by:  Patient   Risks, benefits, and alternatives were discussed: yes     Risks discussed:  Infection, pain, nerve damage, swelling and unsuccessful block   Alternatives discussed:  No treatment Universal protocol:    Patient identity confirmed:  Verbally with patient Indications:    Indications:  Pain relief Location:    Body area:  Upper extremity   Upper extremity nerve blocked: hematoma block.   Laterality:  Right Pre-procedure details:    Skin preparation:  Povidone-iodine Skin anesthesia:    Skin anesthesia method:  None Procedure details:    Block needle gauge:  25 G   Anesthetic injected:  Lidocaine 1% w/o epi   Steroid injected:  None   Additive injected:  None   Injection procedure:  Anatomic landmarks identified, anatomic landmarks palpated, incremental  injection and introduced needle (+ aspiration for blood for hematoma block) Post-procedure details:    Dressing: bandaid.   Procedure completion:  Tolerated Reduction of fracture  Date/Time: 01/26/2023 4:03 AM  Performed by: Jeannie Fend, PA-C Authorized by: Jeannie Fend, PA-C  Consent: Verbal consent obtained. Written consent not obtained. Risks and benefits: risks, benefits and alternatives were discussed Consent given by: patient Patient understanding: patient states understanding of the procedure being performed Imaging studies: imaging studies available Patient identity confirmed: verbally with patient  Sedation: Patient sedated: no  Patient tolerance: patient tolerated the procedure well with no immediate complications Comments:  Reduction with gentle traction with assistance from ortho tech   .Splint Application  Date/Time: 01/26/2023 4:04 AM  Performed by: Jeannie Fend, PA-C Authorized by: Jeannie Fend, PA-C   Consent:    Consent obtained:  Verbal   Consent given by:  Patient   Risks discussed:  Discoloration, numbness, pain and swelling   Alternatives discussed:  No treatment Universal protocol:    Patient identity confirmed:  Verbally with patient Pre-procedure details:    Distal neurologic exam:  Normal   Distal perfusion: distal pulses strong and brisk capillary refill   Procedure details:    Location:  Arm   Arm location:  R lower arm   Splint type:  Sugar tong   Supplies:  Cotton padding, elastic bandage, plaster and sling Post-procedure details:    Distal neurologic exam:  Normal   Distal perfusion: brisk capillary refill     Procedure completion:  Tolerated   Post-procedure imaging: reviewed       Medications Ordered in ED Medications  lidocaine (PF) (XYLOCAINE) 1 % injection 30 mL (30 mLs Infiltration Given 01/26/23 0324)  oxyCODONE (Oxy IR/ROXICODONE) immediate release tablet 5 mg (5 mg Oral Given 01/26/23 0307)    ED Course/  Medical Decision Making/ A&P                                 Medical Decision Making Amount and/or Complexity of Data Reviewed Radiology: ordered.  Risk Prescription drug management.   52 year old female presents with right wrist pain after a mechanical fall today as above. Found to have a closed displaced distal radius fracture, neuro/vascular intact. Discussed with ER attending, Dr. Wilkie Aye, attempted hematoma block and reduction with weight/traction. Patient was placed in a sugar tong splint by ortho tech, post reduction film without significant improvement.  Dc with referral to ortho, rx for oxycodone. Discussed splint care.  Message sent to Dr. Merlyn Lot regarding presentation in the ER today and need for follow up.         Final Clinical Impression(s) / ED Diagnoses Final diagnoses:  Fall, initial encounter  Closed fracture of distal end of right radius, unspecified fracture morphology, initial encounter    Rx / DC Orders ED Discharge Orders          Ordered    oxyCODONE (ROXICODONE) 5 MG immediate release tablet  Every 4 hours PRN        01/26/23 0436              Jeannie Fend, PA-C 01/26/23 9629    Shon Baton, MD 01/26/23 [phone]

## 2023-01-30 ENCOUNTER — Other Ambulatory Visit: Payer: Self-pay | Admitting: Orthopedic Surgery

## 2023-01-30 ENCOUNTER — Telehealth: Payer: Self-pay | Admitting: *Deleted

## 2023-01-30 ENCOUNTER — Telehealth: Payer: Self-pay | Admitting: Cardiology

## 2023-01-30 NOTE — Telephone Encounter (Signed)
S/w the pt and ok per Eula Fried. NP ok to add on 02/09/23 for tele pre op appt due to procedure date. Pt broke her wrist and needs surgery. Med rec and consent are done.     Patient Consent for Virtual Visit        Charlotte Chapman has provided verbal consent on 01/30/2023 for a virtual visit (video or telephone).   CONSENT FOR VIRTUAL VISIT FOR:  Charlotte Chapman  By participating in this virtual visit I agree to the following:  I hereby voluntarily request, consent and authorize [facility] and its employed or contracted physicians, physician assistants, nurse practitioners or other licensed health care professionals (the Practitioner), to provide me with telemedicine health care services (the "Services") as deemed necessary by the treating Practitioner. I acknowledge and consent to receive the Services by the Practitioner via telemedicine. I understand that the telemedicine visit will involve communicating with the Practitioner through live audiovisual communication technology and the disclosure of certain medical information by electronic transmission. I acknowledge that I have been given the opportunity to request an in-person assessment or other available alternative prior to the telemedicine visit and am voluntarily participating in the telemedicine visit.  I understand that I have the right to withhold or withdraw my consent to the use of telemedicine in the course of my care at any time, without affecting my right to future care or treatment, and that the Practitioner or I may terminate the telemedicine visit at any time. I understand that I have the right to inspect all information obtained and/or recorded in the course of the telemedicine visit and may receive copies of available information for a reasonable fee.  I understand that some of the potential risks of receiving the Services via telemedicine include:  Delay or interruption in medical evaluation due to technological  equipment failure or disruption; Information transmitted may not be sufficient (e.g. poor resolution of images) to allow for appropriate medical decision making by the Practitioner; and/or  In rare instances, security protocols could fail, causing a breach of personal health information.  Furthermore, I acknowledge that it is my responsibility to provide information about my medical history, conditions and care that is complete and accurate to the best of my ability. I acknowledge that Practitioner's advice, recommendations, and/or decision may be based on factors not within their control, such as incomplete or inaccurate data provided by me or distortions of diagnostic images or specimens that may result from electronic transmissions. I understand that the practice of medicine is not an exact science and that Practitioner makes no warranties or guarantees regarding treatment outcomes. I acknowledge that a copy of this consent can be made available to me via my patient portal [facility] MyChart), or I can request a printed copy by calling the office of [facility].    I understand that my insurance will be billed for this visit.   I have read or had this consent read to me. I understand the contents of this consent, which adequately explains the benefits and risks of the Services being provided via telemedicine.  I have been provided ample opportunity to ask questions regarding this consent and the Services and have had my questions answered to my satisfaction. I give my informed consent for the services to be provided through the use of telemedicine in my medical care

## 2023-01-30 NOTE — Telephone Encounter (Signed)
   Pre-operative Risk Assessment    Patient Name: Charlotte Chapman  DOB: [birth year] MRN: [id number]      Request for Surgical Clearance    Procedure:   Open Reduction Internal Fixation Right Distal Radius Brachioradialis Release   Date of Surgery:  Clearance 02/13/23                                 Surgeon:  Dr. Betha Loa Surgeon's Group or Practice Name:  [facility] Phone number:  [phone] Fax number:  [phone]   Type of Clearance Requested:   - Medical    Type of Anesthesia:   Choice   Additional requests/questions:   Steward Drone from [facility] called to get medical clearance for the patient  Signed, Tawni Millers   01/30/2023, 1:38 PM

## 2023-01-30 NOTE — Telephone Encounter (Signed)
Primary Cardiologist:Traci Mayford Knife, MD   Preoperative team, please contact this patient and set up a phone call appointment for further preoperative risk assessment. Please obtain consent and complete medication review. Thank you for your help.   I confirm that guidance regarding antiplatelet and oral anticoagulation therapy has been completed and, if necessary, noted below (none requested).   I also confirmed the patient resides in the state of [place]. As per [facility] Medical Board telemedicine laws, the patient must reside in the state in which the provider is licensed.   Levi Aland, NP-C  01/30/2023, 2:59 PM [address], [address] Office [phone] Fax [phone]

## 2023-01-30 NOTE — Telephone Encounter (Signed)
S/w the pt and ok per Eula Fried. NP ok to add on 02/09/23 for tele pre op appt due to procedure date. Pt broke her wrist and needs surgery. Med rec and consent are done.

## 2023-02-05 NOTE — Progress Notes (Signed)
Reviewed cardiac hx with Dr. Jean Rosenthal at [facility]. Patient will need to be moved to main OR due to possibility of needing a central line. Steward Drone at Dr. Merrilee Seashore office aware.

## 2023-02-08 NOTE — Progress Notes (Signed)
Virtual Visit via Telephone Note   Because of Charlotte Chapman's co-morbid illnesses, she is at least at moderate risk for complications without adequate follow up.  This format is felt to be most appropriate for this patient at this time.  The patient did not have access to video technology/had technical difficulties with video requiring transitioning to audio format only (telephone).  All issues noted in this document were discussed and addressed.  No physical exam could be performed with this format.  Please refer to the patient's chart for her consent to telehealth for [facility].  Evaluation Performed:  Preoperative cardiovascular risk assessment _____________   Date:  02/08/2023   Patient ID:  Miachel Chapman, DOB Apr 28, 1970, MRN [id number] Patient Location:  Home Provider location:   Office  Primary Care Provider:  Sigmund Hazel, MD Primary Cardiologist:  Armanda Magic, MD  Chief Complaint / Patient Profile   52 y.o. y/o female with a h/o dyspnea on exertion with unclear etiology, cardiac MRI 2024 with EF 60%, normal RV EF, mild RVE and mild TR with possible left to right shunting across ASD repair, OSA on CPAP, coronary calcium score of 0, chronic HFpEF, ASD s/p repair who is pending open reduction internal fixation right distal radius brachioradialis release with Dr. Merlyn Lot on 02/15/2023 and presents today for telephonic preoperative cardiovascular risk assessment.  History of Present Illness    Charlotte Chapman is a 52 y.o. female who presents via audio/video conferencing for a telehealth visit today.  Pt was last seen in cardiology clinic on 08/25/2022 by Dr. Mayford Knife.  At that time Mekhi Arizola was doing well.  The patient is now pending procedure as outlined above. Since her last visit, she denies chest pain, shortness of breath, lower extremity edema, fatigue, palpitations, weakness, presyncope, syncope, orthopnea, and PND. She is able to achieve > 4 METS activity with  walking her dog and house work without concerning cardiac symptoms.   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 by CTA 4/2024cava, with the left-sided SVC emptying into the left atrium.   DVT (deep venous thrombosis) (HCC)    Heart murmur    Obesity    OSA (obstructive sleep apnea)    Persistent left SVC (superior vena cava)    Drains directly into the left atrium with absent coronary sinus.  PATIENT SHOULD NEVER HAVE AN IV PLACED IN VEIN IN LEFT ARM   RLS (restless legs syndrome)    Low ferritin 6/12 10/13   Social anxiety disorder    Past Surgical History:  Procedure Laterality Date   CARDIAC SURGERY     TONSILLECTOMY     TYMPANOSTOMY TUBE PLACEMENT      Allergies  Allergies  Allergen Reactions   Cat Hair Extract     Other reaction(s): wheezing   Dust Mite Extract Cough   Tree Extract Cough   Duraflex     Home Medications    Prior to Admission medications   Medication Sig Start Date End Date Taking? Authorizing Provider  acetaminophen (TYLENOL) 500 MG tablet Take 500-1,000 mg by mouth every 6 (six)  hours as needed for moderate pain (pain score 4-6).    [provider]  albuterol (VENTOLIN HFA) 108 (90 Base) MCG/ACT inhaler TAKE 2 PUFFS BY MOUTH EVERY 6 HOURS AS NEEDED FOR WHEEZE OR SHORTNESS OF BREATH 12/15/22   Ferol Luz, MD  Calcium Carbonate-Vitamin D (CALCIUM-D PO) Take 1 tablet by mouth daily.    [provider]  cetirizine (ZYRTEC) 10 MG tablet Take 10 mg by mouth daily.    [provider]  citalopram (CELEXA) 40 MG tablet Take 40 mg by mouth daily. 08/19/22   [provider]  ferrous sulfate 325  (65 FE) MG EC tablet Take 325 mg by mouth daily.    [provider]  fluticasone (FLONASE) 50 MCG/ACT nasal spray Place 2 sprays into both nostrils daily.     [provider]  fluticasone-salmeterol (ADVAIR) 100-50 MCG/ACT AEPB Inhale 1 puff into the lungs 2 (two) times daily. 12/25/22   Ferol Luz, MD  furosemide (LASIX) 20 MG tablet TAKE 1 TABLET BY MOUTH EVERY DAY 12/12/22   Quintella Reichert, MD  guaiFENesin (MUCINEX) 600 MG 12 hr tablet Take 600 mg by mouth 2 (two) times daily as needed for cough or to loosen phlegm.    [provider]  hydrOXYzine (ATARAX) 25 MG tablet Take 25 mg by mouth daily as needed for anxiety. 08/18/22   [provider]  Lysine 500 MG CAPS Take 500 mg by mouth daily as needed (canker sore).    [provider]  montelukast (SINGULAIR) 10 MG tablet Take 1 tablet (10 mg total) by mouth daily. 01/08/23   Ferol Luz, MD  Multiple Vitamin (MULTIVITAMIN) tablet Take 1 tablet by mouth daily.    [provider]  oxyCODONE (ROXICODONE) 5 MG immediate release tablet Take 1 tablet (5 mg total) by mouth every 4 (four) hours as needed for severe pain. 01/26/23   Jeannie Fend, PA-C  potassium chloride SA (KLOR-CON M20) 20 MEQ tablet Take 1 tablet (20 mEq total) by mouth daily. 01/12/23   Quintella Reichert, MD  rOPINIRole (REQUIP) 1 MG tablet Take 1 tablet (1 mg total) by mouth at bedtime. 08/25/22   Quintella Reichert, MD    Physical Exam    Vital Signs:  Charlotte Chapman does not have vital signs available for review today.  Given telephonic nature of communication, physical exam is limited. AAOx3. NAD. Normal affect.  Speech and respirations are unlabored.  Accessory Clinical Findings    None  Assessment & Plan    1.  Preoperative Cardiovascular Risk Assessment: According to the Revised Cardiac Risk Index (RCRI), her Perioperative Risk of Major Cardiac Event is (%): 0.9. Her Functional Capacity in METs is: 6.7 according  to the Duke Activity Status Index (DASI). The patient is doing well from a cardiac perspective. Therefore, based on ACC/AHA guidelines, the patient would be at acceptable risk for the planned procedure without further cardiovascular testing.   The patient was advised that if she develops new symptoms prior to surgery to contact our office to arrange for a follow-up visit, and she verbalized understanding.  No request to hold cardiac medications.  A copy of this note will be routed to requesting surgeon.  Time:   Today, I have spent 5 minutes with the patient with telehealth technology discussing medical history, symptoms, and management plan.    Levi Aland, NP-C  02/09/2023, 10:41 AM [address] Office [phone] Fax [phone]

## 2023-02-09 ENCOUNTER — Ambulatory Visit: Payer: Managed Care, Other (non HMO) | Attending: Nurse Practitioner | Admitting: Nurse Practitioner

## 2023-02-09 ENCOUNTER — Encounter: Payer: Self-pay | Admitting: Nurse Practitioner

## 2023-02-09 DIAGNOSIS — Z01810 Encounter for preprocedural cardiovascular examination: Secondary | ICD-10-CM | POA: Diagnosis not present

## 2023-02-13 ENCOUNTER — Encounter (HOSPITAL_COMMUNITY): Payer: Self-pay | Admitting: Orthopedic Surgery

## 2023-02-13 NOTE — Progress Notes (Signed)
SDW call  Patient was given pre-op instructions over the phone. Patient verbalized understanding of instructions provided.     PCP - Dr. Sigmund Hazel Cardiologist - Dr. Armanda Magic Pulmonary:    PPM/ICD - denies Device Orders - na Rep Notified - na   Chest x-ray: 09/07/2021 EKG -  08/25/2022 Stress Test -  ECHO - 01/24/2022 Cardiac Cath -   Sleep Study/sleep apnea/CPAP: Diagnosed with sleep apnea, wears CPAP nightly  Non-diabetic  Blood Thinner Instructions: denies Aspirin Instructions:denies   ERAS Protcol - Clears until 1215   COVID TEST- na    Anesthesia review: Yes. CHF, heart murmur, OSA with CPAP, astham, atrial septal defect, congenital heart disease   Patient denies shortness of breath, fever, cough and chest pain over the phone call  Your procedure is scheduled on Thursday February 15, 2023  Report to [facility] Main Entrance "A" at  1245 PM, then check in with the Admitting office.  Call this number if you have problems the morning of surgery:  [phone]   If you have any questions prior to your surgery date call [phone]: Open Monday-Friday 8am-4pm If you experience any cold or flu symptoms such as cough, fever, chills, shortness of breath, etc. between now and your scheduled surgery, please notify us at the above number     Remember:  Do not eat after midnight the night before your surgery  You may drink clear liquids until 1215 the day of your surgery.   Clear liquids allowed are: Water, Non-Citrus Juices (without pulp), Carbonated Beverages, Clear Tea, Black Coffee ONLY (NO MILK, CREAM OR POWDERED CREAMER of any kind), and Gatorade   Take these medicines the morning of surgery with A SIP OF WATER:  Zyrtec, celexa, flonase, advair, singular  As needed: Tylenol, albuterol, mucinex, hydroxyzine, oxycodone  As of today, STOP taking any Aspirin (unless otherwise instructed by your surgeon) Aleve, Naproxen, Ibuprofen, Motrin, Advil, Goody's, BC's,  all herbal medications, fish oil, and all vitamins.

## 2023-02-14 NOTE — Progress Notes (Signed)
Anesthesia Chart Review: SAME DAY WORK-UP  Case: [id number] Date/Time: 02/15/23 1500   Procedures:      OPEN REDUCTION INTERNAL FIXATION (ORIF) RIGHT DISTAL RADIUS FRACTURE (Right) - 75 MIN     RIGHT BRACHIORADIALIS RELEASE (Right)   Anesthesia type: Choice   Pre-op diagnosis: RIGHT DISTAL RADIUS FRACTURE   Location: [facility] ROOM 07 / [facility] OR   Surgeons: Betha Loa, MD       DISCUSSION: Patient is a 52 year old female scheduled for the above procedure. She had a mechanical fall at work on 01/26/2023 and sustained a right distal radius fracture. 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   She is followed by cardiologist Dr. 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 Last office visit was on 08/25/22. 04/2022 cMRI and 09/2022 CCTA results outlined below. No CAD, and ASD appeared intact without shunting by 09/2022 CCTA. 01/2022 echo demonstrated ASD repair with no residual  shunting seen, LVEF 55-60%, no regional wall motion abnormalities, severe RVH with normal RV systolic function, normal PA pressures, mild-moderate TR. Dr. 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 PATIENT SHOULD NEVER HAVE AN IV PLACED IN VEIN IN LEFT ARM.  She had a telephonic preoperative cardiology assessment by Eligha Bridegroom, NP on 02/09/23 who wrote: "According to the Revised Cardiac Risk Index (RCRI), her Perioperative Risk of Major Cardiac Event is (%): 0.9. Her Functional Capacity in METs is: 6.7 according to the Duke Activity Status Index (DASI). The patient is doing well from a cardiac perspective. Therefore, based on ACC/AHA guidelines, the patient would be at acceptable risk for the planned procedure without further cardiovascular testing."    Anesthesia team to evaluate on the day of surgery.     VS: LMP 09/20/2022 (Approximate)  Wt Readings from Last 3 Encounters:  01/25/23 86.2 kg  12/15/22 87.4 kg  08/25/22 86.8 kg   BP Readings from Last 3 Encounters:  01/26/23 (!) 165/75  12/15/22 (!) 118/90  09/28/22 135/71   Pulse Readings from Last 3 Encounters:  01/26/23 75  12/15/22 (!) 56  08/25/22 63     PROVIDERS: Sigmund Hazel, MD is PCP  Armanda Magic, MD is cardiologist   LABS: For day of surgery as indicated.  As of 09/07/22, BMP and CBC were normal   IMAGES: Xray right wrist (s/p reduction, cast) 01/26/23: IMPRESSION: 1. Status post cast fixation of distal radial and ulnar fractures, as above.  CT Chest (over read CCTA; read by Charlton Haws, MD) 09/28/22: IMPRESSION: No significant extracardiac findings within the visualized chest.     EKG: 08/25/22: Normal sinus rhythm   CV: CT Coronary Morphology 09/28/22: CARDIAC FINDINGS: - Calcium Score: No calcium noted - Coronary Arteries: Indeterminate dominance  - LM: Normal - LAD: No plaquemid vessel tapers in acute angle and  distal LAD not well visualized - IM: Normal - Circumflex: Only small AV groove branch noted - RCA: Normal but appears small and non dominant - No PDA/PLB vessels identified coming off RCA or LCX   - Patient apparently has had ASD repair at age 20. The atrial septum is intact with no shunting across it. The PV drainage appears normal I could not identify. - The coronary sinus. There was a small  posterior lateral vein seen whose connection is not clear. There is a persistent left sided SVC. The scan was not carried out cephalid enough to see the connection to the left subclavian vein but I could see this on the patients previous cardiac MRI done 05/11/22 The left sided SVC appears to drain into the left atrium causing a right to left shunt.   IMPRESSION: 1. Calcium score 0 2. Coronary dominance not clear angulation and tapering of the mid LAD distal vessel not seen and no PDA/PLB vessels seen. 3. Prior atrial septal repair appears intact with no left to right shunt. 4.  Left sided SVC draining into the LA causing right to left shunt. 5.  Absence of normal coronary sinus anatomy. 6.  Normal PV anatomy. - Patient should probably be referred to adult congenital clinic. Prior operative report would be useful Triad of ASD, left sided SVC draining into LA and absent coronary sinus has been described. The current scan was not carried up high enough to evaluate proximal segment of left sided SVC There is no mention of previous baffle of left sided SVC to RA or right sided SVC.     Cardiac MRI 05/04/22: IMPRESSION: 1. S/p ASD repair at age 34. Qp/Qs 1.4 and RV is mildly dilated, suggesting left to right shunting. Recommend cardiac CT or TEE for further evaluation. 2.  Normal LV size, wall thickness, and systolic function (EF 60%). 3.  Mild RV dilatation with normal systolic function (EF 61%). 4.  No late gadolinium enhancement to suggest myocardial scar. 5.  Mild tricuspid regurgitation (regurgitant fraction 13%). 6. Left sided SVC, unclear drainage. Consider cardiac CT as above to better define anatomy.      Echo 01/24/22: IMPRESSIONS   1. S/P ASD repair; no residual shunting noted.   2. Left ventricular ejection fraction, by estimation, is 55 to 60%. The  left ventricle has normal function. The left ventricle has no regional  wall motion abnormalities. Left ventricular diastolic parameters  were  normal.   3. Right ventricular systolic function is normal. The right ventricular  size is severely enlarged. There is normal pulmonary artery systolic  pressure.   4. The mitral valve is normal in structure. Trivial mitral valve  regurgitation. No evidence of mitral stenosis.   5. Tricuspid valve regurgitation is mild to moderate.   6. The aortic valve is tricuspid. Aortic valve regurgitation is not  visualized. No aortic stenosis is present.   7. The inferior vena cava is normal in size with greater than 50%  respiratory variability, suggesting right atrial pressure of 3 mmHg.    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 by CTA 4/2024cava, with the left-sided  SVC emptying into the left atrium.   DVT (deep venous thrombosis) (HCC)    GERD (gastroesophageal reflux disease)    Heart murmur    Obesity    OSA (obstructive sleep apnea)    Persistent left SVC (superior vena cava)    Drains directly into the left atrium with absent coronary sinus.  PATIENT SHOULD NEVER HAVE AN IV PLACED IN VEIN IN LEFT ARM   RLS (restless legs syndrome)    Low ferritin 6/12 10/13   Social anxiety disorder     Past Surgical History:  Procedure Laterality Date   CARDIAC SURGERY     TONSILLECTOMY     TYMPANOSTOMY TUBE PLACEMENT      MEDICATIONS: No current facility-administered medications for this encounter.    acetaminophen (TYLENOL) 500 MG tablet   albuterol (VENTOLIN HFA) 108 (90 Base) MCG/ACT inhaler   Calcium Carbonate-Vitamin D (CALCIUM-D PO)   cetirizine (ZYRTEC) 10 MG tablet   citalopram (CELEXA) 40 MG tablet   ferrous sulfate 325 (65 FE) MG EC tablet   fluticasone (FLONASE)  50 MCG/ACT nasal spray   fluticasone-salmeterol (ADVAIR) 100-50 MCG/ACT AEPB   furosemide (LASIX) 20 MG tablet   guaiFENesin (MUCINEX) 600 MG 12 hr tablet   hydrOXYzine (ATARAX) 25 MG tablet   Lysine 500 MG CAPS   montelukast (SINGULAIR) 10 MG tablet   Multiple Vitamin (MULTIVITAMIN) tablet   oxyCODONE (ROXICODONE) 5 MG immediate release tablet   potassium chloride SA (KLOR-CON M20) 20 MEQ tablet   rOPINIRole (REQUIP) 1 MG tablet    Shonna Chock, PA-C Surgical Short Stay/Anesthesiology [facility] Phone [phone] [facility] Phone [phone] 02/14/2023 10:27 AM

## 2023-02-14 NOTE — Anesthesia Preprocedure Evaluation (Signed)
Anesthesia Evaluation  Patient identified by MRN, date of birth, ID band Patient awake    Reviewed: Allergy & Precautions, NPO status , Patient's Chart, lab work & pertinent test results, reviewed documented beta blocker date and time   Airway Mallampati: IV  TM Distance: >3 FB   Mouth opening: Limited Mouth Opening  Dental  (+) Dental Advisory Given, Chipped,    Pulmonary asthma , sleep apnea and Continuous Positive Airway Pressure Ventilation    Pulmonary exam normal breath sounds clear to auscultation       Cardiovascular +CHF and + DVT  Normal cardiovascular exam+ Valvular Problems/Murmurs  Rhythm:Regular Rate:Normal  Hx/o ASD repair age 32   LVEF 55-60%, no regional wall motion abnormalities, severe RVH with normal RV systolic function, normal PA pressures, mild-moderate TR. Dr. 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 PATIENT SHOULD NEVER HAVE AN IV PLACED IN VEIN IN LEFT ARM.   Neuro/Psych  PSYCHIATRIC DISORDERS Anxiety     negative neurological ROS     GI/Hepatic ,GERD  Medicated,,(+)     (-) substance abuse    Endo/Other  Obesity  Renal/GU negative Renal ROS  negative genitourinary   Musculoskeletal Fx right distal radius/ulna   Abdominal  (+) + obese  Peds  (+) Congenital Heart Disease Hematology negative hematology ROS (+)   Anesthesia Other Findings   Reproductive/Obstetrics                              Anesthesia Physical Anesthesia Plan  ASA: 3  Anesthesia Plan: Regional and MAC   Post-op Pain Management: Regional block* and Minimal or no pain anticipated   Induction: Intravenous  PONV Risk Score and Plan: 3 and Treatment may vary due to age or medical condition, Midazolam, Dexamethasone, Ondansetron and Propofol infusion  Airway Management Planned: Natural Airway and Simple Face Mask  Additional  Equipment: None  Intra-op Plan:   Post-operative Plan:   Informed Consent: I have reviewed the patients History and Physical, chart, labs and discussed the procedure including the risks, benefits and alternatives for the proposed anesthesia with the patient or authorized representative who has indicated his/her understanding and acceptance.     Dental advisory given  Plan Discussed with: CRNA and Anesthesiologist  Anesthesia Plan Comments: (She is followed by cardiologist Dr. 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 Last office visit was on 08/25/22. 04/2022 cMRI and 09/2022 CCTA results outlined below. No CAD, and ASD appeared intact without shunting by 09/2022 CCTA. 01/2022 echo demonstrated ASD repair with no residual  shunting seen, LVEF 55-60%, no regional wall motion abnormalities, severe RVH with normal RV systolic function, normal PA pressures, mild-moderate TR. Dr. 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 PATIENT SHOULD NEVER HAVE AN IV PLACED IN VEIN IN LEFT ARM.   She had a telephonic preoperative cardiology assessment by Eligha Bridegroom, NP on 02/09/23 who wrote: "According to the Revised Cardiac Risk Index (RCRI), her Perioperative Risk of Major Cardiac Event is (%): 0.9. Her Functional Capacity in METs is: 6.7 according to the Duke Activity Status Index (DASI). The patient is doing well from a cardiac perspective. Therefore, based on ACC/AHA guidelines, the patient would be at  acceptable risk for the planned procedure without further cardiovascular testing."    CT Coronary Morphology 09/28/22: CARDIAC FINDINGS: - Calcium Score: No calcium noted - Coronary Arteries: Indeterminate dominance. - LM: Normal - LAD: No plaquemid vessel tapers  in acute angle and distal LAD not well visualized - IM: Normal - Circumflex: Only small AV groove branch noted - RCA: Normal but appears small and non dominant - No PDA/PLB vessels identified coming off RCA or LCX   - Patient apparently has had ASD repair at age 42. The atrial septum is intact with no shunting across it. The PV drainage appears normal I could not identify. - The coronary sinus. There was a small posterior lateral vein seen whose connection is not clear. There is a persistent left sided SVC. The scan was not carried out cephalid enough to see the connection to the left subclavian vein but I could see this on the patients previous cardiac MRI done 05/11/22 The left sided SVC appears to drain into the left atrium causing a right to left shunt.   IMPRESSION: 1. Calcium score 0 2. Coronary dominance not clear angulation and tapering of the mid LAD distal vessel not seen and no PDA/PLB vessels seen. 3. Prior atrial septal repair appears intact with no left to right shunt. 4.  Left sided SVC draining into the LA causing right to left shunt. 5.  Absence of normal coronary sinus anatomy. 6.  Normal PV anatomy. - Patient should probably be referred to adult congenital clinic. Prior operative report would be useful Triad of ASD, left sided SVC draining into LA and absent coronary sinus has been described. The current scan was not carried up high enough to evaluate proximal segment of left sided SVC There is no mention of previous baffle of left sided SVC to RA or right sided SVC.     Cardiac MRI 05/04/22: IMPRESSION: 1. S/p ASD repair at age 65. Qp/Qs 1.4 and RV is mildly dilated, suggesting left to right shunting. Recommend cardiac CT or TEE for further evaluation. 2.  Normal LV size, wall thickness, and systolic function (EF 60%). 3.  Mild RV dilatation with normal systolic function (EF 61%). 4.  No late gadolinium enhancement to suggest myocardial scar. 5.  Mild tricuspid regurgitation  (regurgitant fraction 13%). 6. Left sided SVC, unclear drainage. Consider cardiac CT as above to better define anatomy.    Echo 01/24/22: IMPRESSIONS   1. S/P ASD repair; no residual shunting noted.   2. Left ventricular ejection fraction, by estimation, is 55 to 60%. The  left ventricle has normal function. The left ventricle has no regional  wall motion abnormalities. Left ventricular diastolic parameters were  normal.   3. Right ventricular systolic function is normal. The right ventricular  size is severely enlarged. There is normal pulmonary artery systolic  pressure.   4. The mitral valve is normal in structure. Trivial mitral valve  regurgitation. No evidence of mitral stenosis.   5. Tricuspid valve regurgitation is mild to moderate.   6. The aortic valve is tricuspid. Aortic valve regurgitation is not  visualized. No aortic stenosis is present.   7. The inferior vena cava is normal in size with greater than 50%  respiratory variability, suggesting right atrial pressure of 3 mmHg.   )         Anesthesia Quick Evaluation

## 2023-02-15 ENCOUNTER — Other Ambulatory Visit: Payer: Self-pay

## 2023-02-15 ENCOUNTER — Encounter (HOSPITAL_COMMUNITY)
Admission: RE | Disposition: A | Discharge status: Home / Self Care | Payer: Self-pay | Source: Home / Self Care | Attending: Orthopedic Surgery

## 2023-02-15 ENCOUNTER — Ambulatory Visit (HOSPITAL_COMMUNITY)
Admission: RE | Admit: 2023-02-15 | Discharge: 2023-02-15 | Disposition: A | Discharge status: Home / Self Care | Payer: Worker's Compensation | Attending: Orthopedic Surgery | Admitting: Orthopedic Surgery

## 2023-02-15 ENCOUNTER — Encounter (HOSPITAL_COMMUNITY): Payer: Self-pay | Admitting: Orthopedic Surgery

## 2023-02-15 ENCOUNTER — Ambulatory Visit (HOSPITAL_COMMUNITY): Payer: Worker's Compensation

## 2023-02-15 ENCOUNTER — Ambulatory Visit (HOSPITAL_BASED_OUTPATIENT_CLINIC_OR_DEPARTMENT_OTHER): Payer: Worker's Compensation | Admitting: Vascular Surgery

## 2023-02-15 ENCOUNTER — Ambulatory Visit (HOSPITAL_COMMUNITY): Payer: Worker's Compensation | Admitting: Vascular Surgery

## 2023-02-15 DIAGNOSIS — F419 Anxiety disorder, unspecified: Secondary | ICD-10-CM | POA: Insufficient documentation

## 2023-02-15 DIAGNOSIS — G4730 Sleep apnea, unspecified: Secondary | ICD-10-CM | POA: Diagnosis not present

## 2023-02-15 DIAGNOSIS — I509 Heart failure, unspecified: Secondary | ICD-10-CM

## 2023-02-15 DIAGNOSIS — W19XXXA Unspecified fall, initial encounter: Secondary | ICD-10-CM | POA: Insufficient documentation

## 2023-02-15 DIAGNOSIS — Z86718 Personal history of other venous thrombosis and embolism: Secondary | ICD-10-CM | POA: Insufficient documentation

## 2023-02-15 DIAGNOSIS — Z6833 Body mass index (BMI) 33.0-33.9, adult: Secondary | ICD-10-CM | POA: Insufficient documentation

## 2023-02-15 DIAGNOSIS — E669 Obesity, unspecified: Secondary | ICD-10-CM | POA: Insufficient documentation

## 2023-02-15 DIAGNOSIS — K219 Gastro-esophageal reflux disease without esophagitis: Secondary | ICD-10-CM | POA: Diagnosis not present

## 2023-02-15 DIAGNOSIS — S52551A Other extraarticular fracture of lower end of right radius, initial encounter for closed fracture: Secondary | ICD-10-CM | POA: Insufficient documentation

## 2023-02-15 DIAGNOSIS — J45909 Unspecified asthma, uncomplicated: Secondary | ICD-10-CM | POA: Insufficient documentation

## 2023-02-15 HISTORY — DX: Gastro-esophageal reflux disease without esophagitis: K21.9

## 2023-02-15 HISTORY — PX: OPEN REDUCTION INTERNAL FIXATION (ORIF) DISTAL RADIAL FRACTURE: SHX5989

## 2023-02-15 HISTORY — PX: FLEXOR TENOTOMY: SHX6342

## 2023-02-15 LAB — BASIC METABOLIC PANEL
Anion gap: 8 (ref 5–15)
BUN: 14 mg/dL (ref 6–20)
CO2: 28 mmol/L (ref 22–32)
Calcium: 9.3 mg/dL (ref 8.9–10.3)
Chloride: 107 mmol/L (ref 98–111)
Creatinine, Ser: 0.72 mg/dL (ref 0.44–1.00)
GFR, Estimated: >60 mL/min (ref >60)
Glucose, Bld: 88 mg/dL (ref 70–99)
Potassium: 3.5 mmol/L (ref 3.5–5.1)
Sodium: 143 mmol/L (ref 135–145)

## 2023-02-15 LAB — CBC
HCT: 41.7 % (ref 36.0–46.0)
Hemoglobin: 13.5 g/dL (ref 12.0–15.0)
MCH: 29.7 pg (ref 26.0–34.0)
MCHC: 32.4 g/dL (ref 30.0–36.0)
MCV: 91.9 fL (ref 80.0–100.0)
Platelets: 273 10*3/uL (ref 150–400)
RBC: 4.54 MIL/uL (ref 3.87–5.11)
RDW: 12.5 % (ref 11.5–15.5)
WBC: 5.8 10*3/uL (ref 4.0–10.5)
nRBC: 0 % (ref 0.0–0.2)

## 2023-02-15 LAB — SURGICAL PCR SCREEN
MRSA, PCR: NEGATIVE
Staphylococcus aureus: NEGATIVE

## 2023-02-15 LAB — POCT PREGNANCY, URINE: Preg Test, Ur: NEGATIVE

## 2023-02-15 SURGERY — OPEN REDUCTION INTERNAL FIXATION (ORIF) DISTAL RADIUS FRACTURE
Anesthesia: Monitor Anesthesia Care | Site: Arm Lower | Laterality: Right

## 2023-02-15 MED ORDER — ORAL CARE MOUTH RINSE: 15.0000 mL | Freq: Once | OROMUCOSAL | Status: AC

## 2023-02-15 MED ORDER — FENTANYL CITRATE (PF) 250 MCG/5ML IJ SOLN
INTRAMUSCULAR | Status: DC | PRN
  Administered 2023-02-15: 50 ug via INTRAVENOUS
  Administered 2023-02-15 (×2): 25 ug via INTRAVENOUS
  Administered 2023-02-15: 50 ug via INTRAVENOUS

## 2023-02-15 MED ORDER — ONDANSETRON HCL 4 MG/2ML IJ SOLN: 4.0000 mg | Freq: Once | INTRAMUSCULAR | Status: DC | PRN

## 2023-02-15 MED ORDER — FENTANYL CITRATE (PF) 100 MCG/2ML IJ SOLN: 50.0000 ug | Freq: Once | INTRAMUSCULAR | Status: AC

## 2023-02-15 MED ORDER — AMISULPRIDE (ANTIEMETIC) 5 MG/2ML IV SOLN: 10.0000 mg | Freq: Once | INTRAVENOUS | Status: DC | PRN

## 2023-02-15 MED ORDER — FENTANYL CITRATE (PF) 250 MCG/5ML IJ SOLN
INTRAMUSCULAR | Status: AC
  Filled 2023-02-15: qty 5

## 2023-02-15 MED ORDER — OXYCODONE HCL 5 MG PO TABS
ORAL_TABLET | ORAL | Status: AC
  Filled 2023-02-15: qty 1

## 2023-02-15 MED ORDER — EPHEDRINE 5 MG/ML INJ
INTRAVENOUS | Status: AC
  Filled 2023-02-15: qty 10

## 2023-02-15 MED ORDER — CEFAZOLIN SODIUM-DEXTROSE 2-4 GM/100ML-% IV SOLN
2.0000 g | INTRAVENOUS | Status: AC
  Administered 2023-02-15: 2 g via INTRAVENOUS
  Filled 2023-02-15: qty 100

## 2023-02-15 MED ORDER — FENTANYL CITRATE (PF) 100 MCG/2ML IJ SOLN
25.0000 ug | INTRAMUSCULAR | Status: DC | PRN
  Administered 2023-02-15 (×2): 50 ug via INTRAVENOUS

## 2023-02-15 MED ORDER — LIDOCAINE 2% (20 MG/ML) 5 ML SYRINGE
INTRAMUSCULAR | Status: AC
  Filled 2023-02-15: qty 15

## 2023-02-15 MED ORDER — OXYCODONE-ACETAMINOPHEN 5-325 MG PO TABS: ORAL_TABLET | ORAL | 0 refills | Status: DC

## 2023-02-15 MED ORDER — MIDAZOLAM HCL 2 MG/2ML IJ SOLN: 1.0000 mg | Freq: Once | INTRAMUSCULAR | Status: AC

## 2023-02-15 MED ORDER — MIDAZOLAM HCL 2 MG/2ML IJ SOLN
INTRAMUSCULAR | Status: DC | PRN
  Administered 2023-02-15: 2 mg via INTRAVENOUS

## 2023-02-15 MED ORDER — DEXMEDETOMIDINE HCL IN NACL 200 MCG/50ML IV SOLN
INTRAVENOUS | Status: DC | PRN
  Administered 2023-02-15: 8 ug via INTRAVENOUS

## 2023-02-15 MED ORDER — PROPOFOL 10 MG/ML IV BOLUS
INTRAVENOUS | Status: AC
  Filled 2023-02-15: qty 20

## 2023-02-15 MED ORDER — OXYCODONE HCL 5 MG/5ML PO SOLN: 5.0000 mg | Freq: Once | ORAL | Status: DC | PRN

## 2023-02-15 MED ORDER — MIDAZOLAM HCL 2 MG/2ML IJ SOLN
INTRAMUSCULAR | Status: AC
  Administered 2023-02-15: 1 mg via INTRAVENOUS
  Filled 2023-02-15: qty 2

## 2023-02-15 MED ORDER — ROPIVACAINE HCL 5 MG/ML IJ SOLN
INTRAMUSCULAR | Status: DC | PRN
  Administered 2023-02-15: 30 mL via PERINEURAL

## 2023-02-15 MED ORDER — PROPOFOL 500 MG/50ML IV EMUL
INTRAVENOUS | Status: DC | PRN
  Administered 2023-02-15: 50 ug/kg/min via INTRAVENOUS

## 2023-02-15 MED ORDER — PROPOFOL 10 MG/ML IV BOLUS
INTRAVENOUS | Status: DC | PRN
  Administered 2023-02-15: 20 mg via INTRAVENOUS

## 2023-02-15 MED ORDER — 0.9 % SODIUM CHLORIDE (POUR BTL) OPTIME
TOPICAL | Status: DC | PRN
  Administered 2023-02-15: 1000 mL

## 2023-02-15 MED ORDER — OXYCODONE HCL 5 MG PO TABS: 5.0000 mg | ORAL_TABLET | Freq: Once | ORAL | Status: DC | PRN

## 2023-02-15 MED ORDER — DEXMEDETOMIDINE HCL IN NACL 80 MCG/20ML IV SOLN
INTRAVENOUS | Status: AC
  Filled 2023-02-15: qty 20

## 2023-02-15 MED ORDER — LIDOCAINE 2% (20 MG/ML) 5 ML SYRINGE
INTRAMUSCULAR | Status: DC | PRN
  Administered 2023-02-15: 20 mg via INTRAVENOUS

## 2023-02-15 MED ORDER — ONDANSETRON HCL 4 MG/2ML IJ SOLN
INTRAMUSCULAR | Status: DC | PRN
  Administered 2023-02-15: 4 mg via INTRAVENOUS

## 2023-02-15 MED ORDER — SODIUM CHLORIDE 0.9 % IV SOLN: INTRAVENOUS | Status: DC

## 2023-02-15 MED ORDER — MIDAZOLAM HCL 2 MG/2ML IJ SOLN
INTRAMUSCULAR | Status: AC
  Filled 2023-02-15: qty 2

## 2023-02-15 MED ORDER — CHLORHEXIDINE GLUCONATE 0.12 % MT SOLN
15.0000 mL | Freq: Once | OROMUCOSAL | Status: AC
  Administered 2023-02-15: 15 mL via OROMUCOSAL
  Filled 2023-02-15: qty 15

## 2023-02-15 MED ORDER — PHENYLEPHRINE HCL (PRESSORS) 10 MG/ML IV SOLN
INTRAVENOUS | Status: AC
  Filled 2023-02-15: qty 1

## 2023-02-15 MED ORDER — EPHEDRINE SULFATE-NACL 50-0.9 MG/10ML-% IV SOSY
PREFILLED_SYRINGE | INTRAVENOUS | Status: DC | PRN
  Administered 2023-02-15 (×3): 5 mg via INTRAVENOUS

## 2023-02-15 MED ORDER — FENTANYL CITRATE (PF) 100 MCG/2ML IJ SOLN
INTRAMUSCULAR | Status: AC
  Administered 2023-02-15: 50 ug via INTRAVENOUS
  Filled 2023-02-15: qty 2

## 2023-02-15 MED ORDER — FENTANYL CITRATE (PF) 100 MCG/2ML IJ SOLN
INTRAMUSCULAR | Status: AC
  Filled 2023-02-15: qty 2

## 2023-02-15 MED ORDER — DEXAMETHASONE SODIUM PHOSPHATE 10 MG/ML IJ SOLN
INTRAMUSCULAR | Status: DC | PRN
  Administered 2023-02-15: 10 mg via INTRAVENOUS

## 2023-02-15 SURGICAL SUPPLY — 74 items
BAG COUNTER SPONGE SURGICOUNT (BAG) ×1 IMPLANT
BAG SPNG CNTER NS LX DISP (BAG) ×1
BIT DRILL 2.0 LNG QUCK RELEASE (BIT) IMPLANT
BIT DRILL QC 2.8X5 (BIT) IMPLANT
BLADE CLIPPER SURG (BLADE) IMPLANT
BNDG CMPR 5X3 KNIT ELC UNQ LF (GAUZE/BANDAGES/DRESSINGS)
BNDG CMPR 5X4 KNIT ELC UNQ LF (GAUZE/BANDAGES/DRESSINGS) ×1
BNDG CMPR 9X4 STRL LF SNTH (GAUZE/BANDAGES/DRESSINGS)
BNDG ELASTIC 3INX 5YD STR LF (GAUZE/BANDAGES/DRESSINGS) ×1 IMPLANT
BNDG ELASTIC 4INX 5YD STR LF (GAUZE/BANDAGES/DRESSINGS) IMPLANT
BNDG ELASTIC 4X5.8 VLCR STR LF (GAUZE/BANDAGES/DRESSINGS) ×1 IMPLANT
BNDG ESMARK 4X9 LF (GAUZE/BANDAGES/DRESSINGS) ×1 IMPLANT
BNDG GAUZE DERMACEA FLUFF 4 (GAUZE/BANDAGES/DRESSINGS) ×1 IMPLANT
BNDG GZE DERMACEA 4 6PLY (GAUZE/BANDAGES/DRESSINGS) ×1
CORD BIPOLAR FORCEPS 12FT (ELECTRODE) ×1 IMPLANT
COVER SURGICAL LIGHT HANDLE (MISCELLANEOUS) ×1 IMPLANT
CUFF TOURN SGL QUICK 18X4 (TOURNIQUET CUFF) ×1 IMPLANT
CUFF TOURN SGL QUICK 24 (TOURNIQUET CUFF)
CUFF TRNQT CYL 24X4X16.5-23 (TOURNIQUET CUFF) IMPLANT
DRAIN TLS ROUND 10FR (DRAIN) IMPLANT
DRAPE OEC MINIVIEW 54X84 (DRAPES) IMPLANT
DRAPE SURG 17X23 STRL (DRAPES) ×1 IMPLANT
DRILL 2.0 LNG QUICK RELEASE (BIT) ×1
DRSG XEROFORM 1X8 (GAUZE/BANDAGES/DRESSINGS) IMPLANT
GAUZE SPONGE 4X4 12PLY STRL (GAUZE/BANDAGES/DRESSINGS) ×1 IMPLANT
GAUZE XEROFORM 1X8 LF (GAUZE/BANDAGES/DRESSINGS) ×1 IMPLANT
GLOVE BIO SURGEON STRL SZ7.5 (GLOVE) ×1 IMPLANT
GLOVE BIOGEL PI IND STRL 8 (GLOVE) ×1 IMPLANT
GOWN STRL REUS W/ TWL LRG LVL3 (GOWN DISPOSABLE) ×3 IMPLANT
GOWN STRL REUS W/ TWL XL LVL3 (GOWN DISPOSABLE) ×3 IMPLANT
GOWN STRL REUS W/TWL LRG LVL3 (GOWN DISPOSABLE) ×3
GOWN STRL REUS W/TWL XL LVL3 (GOWN DISPOSABLE) ×3
GUIDEWIRE ORTHO 0.054X6 (WIRE) IMPLANT
KIT BASIN OR (CUSTOM PROCEDURE TRAY) ×1 IMPLANT
KIT TURNOVER KIT B (KITS) ×1 IMPLANT
LOOP VASCLR MAXI BLUE 18IN ST (MISCELLANEOUS) IMPLANT
MANIFOLD NEPTUNE II (INSTRUMENTS) ×1 IMPLANT
NDL 22X1.5 STRL (OR ONLY) (MISCELLANEOUS) IMPLANT
NEEDLE 22X1.5 STRL (OR ONLY) (MISCELLANEOUS)
NS IRRIG 1000ML POUR BTL (IV SOLUTION) ×1 IMPLANT
PACK ORTHO EXTREMITY (CUSTOM PROCEDURE TRAY) ×1 IMPLANT
PAD ARMBOARD 7.5X6 YLW CONV (MISCELLANEOUS) ×2 IMPLANT
PAD CAST 4YDX4 CTTN HI CHSV (CAST SUPPLIES) ×1 IMPLANT
PADDING CAST COTTON 4X4 STRL (CAST SUPPLIES) ×1
PLATE R NARROW PROC VDR (Plate) IMPLANT
SCREW BN FT 16X2.3XLCK HEX CRT (Screw) IMPLANT
SCREW CORT FT 18X2.3XLCK HEX (Screw) IMPLANT
SCREW CORTICAL LOCKING 2.3X16M (Screw) ×1 IMPLANT
SCREW CORTICAL LOCKING 2.3X18M (Screw) ×4 IMPLANT
SCREW CORTICAL LOCKING 2.3X20M (Screw) ×2 IMPLANT
SCREW CORTICAL LOCKING 2.3X22M (Screw) ×1 IMPLANT
SCREW FX18X2.3XSMTH LCK NS CRT (Screw) IMPLANT
SCREW FX20X2.3XSMTH LCK NS CRT (Screw) IMPLANT
SCREW FX22X2.3XLCK SMTH NS CRT (Screw) IMPLANT
SCREW HEXALOBE NON-LOCK 3.5X16 (Screw) IMPLANT
SCREW NON TOGG 2.3X22MM (Screw) IMPLANT
SCREW NONLOCK HEX 3.5X12 (Screw) IMPLANT
SPIKE FLUID TRANSFER (MISCELLANEOUS) IMPLANT
SPONGE T-LAP 4X18 ~~LOC~~+RFID (SPONGE) IMPLANT
SUT ETHILON 4 0 CL P 3 (SUTURE) IMPLANT
SUT ETHILON 4 0 PS 2 18 (SUTURE) IMPLANT
SUT MNCRL AB 4-0 PS2 18 (SUTURE) ×1 IMPLANT
SUT PROLENE 3 0 PS 2 (SUTURE) IMPLANT
SUT VIC AB 3-0 FS2 27 (SUTURE) IMPLANT
SUT VIC AB 4-0 PS2 18 (SUTURE) IMPLANT
SYR CONTROL 10ML LL (SYRINGE) IMPLANT
SYSTEM CHEST DRAIN TLS 7FR (DRAIN) IMPLANT
TOWEL GREEN STERILE (TOWEL DISPOSABLE) ×1 IMPLANT
TOWEL GREEN STERILE FF (TOWEL DISPOSABLE) ×1 IMPLANT
TUBE CONNECTING 12X1/4 (SUCTIONS) ×1 IMPLANT
TUBE EVACUATION TLS (MISCELLANEOUS) ×1 IMPLANT
UNDERPAD 30X36 HEAVY ABSORB (UNDERPADS AND DIAPERS) ×1 IMPLANT
VASCULAR TIE MAXI BLUE 18IN ST (MISCELLANEOUS)
WATER STERILE IRR 1000ML POUR (IV SOLUTION) ×1 IMPLANT

## 2023-02-15 NOTE — Anesthesia Procedure Notes (Signed)
Anesthesia Regional Block: Axillary brachial plexus block   Pre-Anesthetic Checklist: , timeout performed,  Correct Patient, Correct Site, Correct Laterality,  Correct Procedure, Correct Position, site marked,  Risks and benefits discussed,  Surgical consent,  Pre-op evaluation,  At surgeon's request  Laterality: Right  Prep: chloraprep       Needles:  Injection technique: Single-shot  Needle Type: Echogenic Stimulator Needle     Needle Length: 10cm  Needle Gauge: 21   Needle insertion depth: 5 cm   Additional Needles:   Procedures:,,,, ultrasound used (permanent image in chart),,   Motor weakness within 5 minutes.  Narrative:  Start time: 02/15/2023 2:07 PM End time: 02/15/2023 2:12 PM Injection made incrementally with aspirations every 5 mL.  Performed by: Personally  Anesthesiologist: Mal Amabile, MD  Additional Notes: Timeout performed. Patient sedated. Relevant anatomy ID'd using [place]. Incremental 2-28ml injection of LA with frequent aspiration. Patient tolerated procedure well.     Right Axillary Block

## 2023-02-15 NOTE — Discharge Instructions (Signed)

## 2023-02-15 NOTE — Op Note (Signed)
I assisted Surgeons and Role:    * Betha Loa, MD - Primary    Cindee Salt, MD - Assisting on the Procedure(s): OPEN REDUCTION INTERNAL FIXATION (ORIF) RIGHT DISTAL RADIUS FRACTURE RIGHT BRACHIORADIALIS RELEASE on 02/15/2023.  I provided assistance on this case as follows: Set up, approach, identification of the fracture, protection of the radial artery, use of the brachioradialis, release of the dorsal periosteum, mobilization of the fracture, reduction and stabilization of the fracture, station of the fracture with a volar fixed angle locking plate, closure of the wound and application of the dressing and splint.  Electronically signed by: Cindee Salt, MD Date: 02/15/2023 Time: 5:18 PM

## 2023-02-15 NOTE — Op Note (Signed)
02/15/2023 [facility] OR  Operative Note  Pre Op Diagnosis: Right extraarticular distal radius fracture  Post Op Diagnosis: Right extraarticular distal radius fracture  Procedure:  ORIF right extraarticular distal radius fracture Right brachioradialis release  Surgeon: Betha Loa, MD  Assistant: Cindee Salt, MD  Anesthesia: Regional with sedation  Fluids: Per anesthesia flow sheet  EBL: minimal  Complications: None  Specimen: None  Tourniquet Time:  Total Tourniquet Time Documented: Upper Arm (Right) - 60 minutes Total: Upper Arm (Right) - 60 minutes   Disposition: Stable to PACU  INDICATIONS:  Charlotte Chapman is a 52 y.o. female states she fell approximately 3 weeks ago injuring her right wrist.  Radiographs taken at the emergency department show distal radius fracture.  Close reduction was performed at the emergency department staff.  She was splinted and follow-up in the office.  We discussed nonoperative and operative treatment options.  She wished to proceed with operative fixation.  Risks, benefits, and alternatives of surgery were discussed including the risk of blood loss; infection; damage to nerves, vessels, tendons, ligaments, bone; failure of surgery; need for additional surgery; complications with wound healing; continued pain; nonunion; malunion; stiffness.  We also discussed the possible need for bone graft and the benefits and risks including the possibility of disease transmission.  She voiced understanding of these risks and elected to proceed.    OPERATIVE COURSE:  After being identified preoperatively by myself, the patient and I agreed upon the procedure and site of procedure.  Surgical site was marked.   Surgical consent had been signed.  She was given IV Ancef as preoperative antibiotic prophylaxis.  She was transferred to the operating room and placed on the operating room table in supine position with the right upper extremity on an armboard. Sedation was  induced by the anesthesiologist.  A regional block had been performed by anesthesia in preoperative holding.  The right upper extremity was prepped and draped in normal sterile orthopedic fashion.  A surgical pause was performed between the surgeons, anesthesia and operating room staff, and all were in agreement as to the patient, procedure and site of procedure.  Tourniquet at the proximal aspect of the extremity was inflated to 250 mmHg after exsanguination of the limb with an Esmarch bandage.  Standard volar Sherilyn Cooter approach was used.  The bipolar electrocautery was used to obtain hemostasis.  The superficial and deep portions of the FCR tendon sheath were incised, and the FCR and FPL were swept ulnarly to protect the palmar cutaneous branch of the median nerve.  The brachioradialis was released at the radial side of the radius.  The pronator quadratus was released and elevated with the periosteal elevator.  The fracture site was identified and cleared of soft tissue interposition and hematoma.  It was reduced under direct visualization.  The dorsal periosteum was released to aid in reduction.  It was difficult to bring the radius into full reduction with restoration of volar tilt.  An [employer] volar distal radial locking plate was selected.  It was secured to the bone with the guidepins.  It was secured to the distal fragment first and then clamped down to the shaft with the lobster-claw clamp.  C-arm was used in AP and lateral projections to ensure appropriate reduction and position of the hardware.  Standard AO drilling and measuring technique was used. The distal holes were filled with locking pegs.  The radial most was filled with a nonlocking screw provisionally to bring the plate up to the bone.  The styloid holes were filled with locking screws.  The plate was then reduced back down to the shaft and provisionally held with the bone clamp and pins.  The C-arm was used in AP and lateral projections to ensure  appropriate reduction position of the plate which was the case.  The holes in the shaft of the plate were filled with nonlocking screws.  Good purchase was obtained.  The nonlocking screw distally was exchanged for a locking peg.  C-arm was used in AP, lateral and oblique projections to ensure appropriate reduction and position of hardware, which was the case.  There was no intra-articular penetration of hardware.  The distal locking pegs appeared long and these were all exchanged for shorter pegs.  Repeat radiographs showed appropriate length of the pegs and good reduction and position of hardware.  The wound was copiously irrigated with sterile saline.  Pronator quadratus was repaired back over top of the plate using 4-0 Vicryl suture.  Vicryl suture was placed in the subcutaneous tissues in an inverted interrupted fashion and the skin was closed with 4-0 nylon in a horizontal mattress fashion.  There was good pronation and supination of the wrist without crepitance.  The wound was then dressed with sterile Xeroform, 4x4s, and wrapped with a Kerlix bandage.  A volar splint was placed and wrapped with Kerlix and Ace bandage.  Tourniquet was deflated at 60 minutes.  Fingertips were pink with brisk capillary refill after deflation of the tourniquet.  Operative drapes were broken down.  The patient was awoken from anesthesia safely.  She was transferred back to the stretcher and taken to the PACU in stable condition.  I will see her back in the office in one week for postoperative followup.  I will give her a prescription for Percocet 5/325 1-2 tabs PO q6 hours prn pain, dispense # 20.    Betha Loa, MD Electronically signed, 02/15/23

## 2023-02-15 NOTE — H&P (Signed)
Charlotte Chapman is an 52 y.o. female.   Chief Complaint: wrist fracture HPI: 52 yo female states she fell at work 01/25/23 injuring right wrist.  Seen at [facility] where XR revealed right distal radius fracture.  Splinted and followed up in office.  She wishes to proceed with operative reduction and fixation.  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 by CTA 4/2024cava, with the left-sided SVC emptying into the left atrium.   DVT (deep venous thrombosis) (HCC)    GERD (gastroesophageal reflux disease)    Heart murmur    Obesity    OSA (obstructive sleep apnea)    Persistent left SVC (superior vena cava)    Drains directly into the left atrium with absent coronary sinus.  PATIENT SHOULD NEVER HAVE AN IV PLACED IN VEIN IN LEFT ARM   RLS (restless legs syndrome)    Low ferritin 6/12 10/13   Social anxiety disorder     Past Surgical History:  Procedure Laterality Date   CARDIAC SURGERY     TONSILLECTOMY     TYMPANOSTOMY TUBE PLACEMENT      Family History: Family History  Problem Relation Age of Onset   CAD Father    Hypertension Father    Cancer - Prostate Father    Cancer Father    Heart disease Father    Asthma Sister    Diabetes Mother    Autoimmune disease Mother     Social History:   reports that she has never smoked. She has never used smokeless tobacco. She reports that she does not currently use alcohol. She reports that she does not use drugs.  Medications: Medications Prior to Admission  Medication Sig  Dispense Refill   acetaminophen (TYLENOL) 500 MG tablet Take 500-1,000 mg by mouth every 6 (six) hours as needed for moderate pain (pain score 4-6).     albuterol (VENTOLIN HFA) 108 (90 Base) MCG/ACT inhaler TAKE 2 PUFFS BY MOUTH EVERY 6 HOURS AS NEEDED FOR WHEEZE OR SHORTNESS OF BREATH 18 g 1   Calcium Carbonate-Vitamin D (CALCIUM-D PO) Take 1 tablet by mouth daily.     cetirizine (ZYRTEC) 10 MG tablet Take 10 mg by mouth daily.     citalopram (CELEXA) 40 MG tablet Take 40 mg by mouth daily.     ferrous sulfate 325 (65 FE) MG EC tablet Take 325 mg by mouth daily.     fluticasone (FLONASE) 50 MCG/ACT nasal spray Place 2 sprays into both nostrils daily.      fluticasone-salmeterol (ADVAIR) 100-50 MCG/ACT AEPB Inhale 1 puff into the lungs 2 (two) times daily. 180 each 1   furosemide (LASIX) 20 MG tablet TAKE 1 TABLET BY MOUTH EVERY DAY 90 tablet 2   guaiFENesin (MUCINEX) 600 MG 12 hr tablet Take 600 mg by mouth 2 (two) times daily as needed for cough or to loosen phlegm.     hydrOXYzine (ATARAX) 25 MG tablet  Take 25 mg by mouth daily as needed for anxiety.     Lysine 500 MG CAPS Take 500 mg by mouth daily as needed (canker sore).     montelukast (SINGULAIR) 10 MG tablet Take 1 tablet (10 mg total) by mouth daily. 90 tablet 0   Multiple Vitamin (MULTIVITAMIN) tablet Take 1 tablet by mouth daily.     oxyCODONE (ROXICODONE) 5 MG immediate release tablet Take 1 tablet (5 mg total) by mouth every 4 (four) hours as needed for severe pain. 12 tablet 0   potassium chloride SA (KLOR-CON M20) 20 MEQ tablet Take 1 tablet (20 mEq total) by mouth daily. 90 tablet 2   rOPINIRole (REQUIP) 1 MG tablet Take 1 tablet (1 mg total) by mouth at bedtime. 90 tablet 3    Results for orders placed or performed during the hospital encounter of 02/15/23 (from the past 48 hour(s))  Pregnancy, urine POC     Status: None   Collection Time: 02/15/23  1:29 PM  Result Value Ref Range   Preg Test, Ur NEGATIVE NEGATIVE     Comment:        THE SENSITIVITY OF THIS METHODOLOGY IS >24 mIU/mL     No results found.    Blood pressure (!) 173/81, pulse (!) 58, temperature 98.1 F (36.7 C), temperature source Oral, resp. rate 17, height 5\' 3"  (1.6 m), weight 86.2 kg, last menstrual period 09/20/2022, SpO2 95%.  General appearance: alert, cooperative, and appears stated age Head: Normocephalic, without obvious abnormality, atraumatic Neck: supple, symmetrical, trachea midline Extremities: Intact sensation and capillary refill all digits.  +epl/fpl/io.  No wounds.  Pulses: 2+ and symmetric Skin: Skin color, texture, turgor normal. No rashes or lesions Neurologic: Grossly normal Incision/Wound: none  Assessment/Plan Right distal radius fracture.  Non operative and operative treatment options have been discussed with the patient and patient wishes to proceed with operative treatment. Risks, benefits, and alternatives of surgery have been discussed and the patient agrees with the plan of care.   Betha Loa 02/15/2023, 2:11 PM

## 2023-02-15 NOTE — Anesthesia Postprocedure Evaluation (Signed)
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) Performed: OPEN REDUCTION INTERNAL FIXATION (ORIF) RIGHT DISTAL RADIUS FRACTURE (Right: Arm Lower) RIGHT BRACHIORADIALIS RELEASE (Right: Arm Lower)     Patient location during evaluation: PACU Anesthesia Type: Regional and MAC Level of consciousness: awake and alert and oriented Pain management: pain level controlled Vital Signs Assessment: post-procedure vital signs reviewed and stable Respiratory status: spontaneous breathing, nonlabored ventilation and respiratory function stable Cardiovascular status: stable and blood pressure returned to baseline Postop Assessment: no apparent nausea or vomiting Anesthetic complications: no   No notable events documented.  Last Vitals:  Vitals:   02/15/23 1815 02/15/23 1831  BP: (!) 157/68 (!) 144/76  Pulse: 60 (!) 56  Resp: 16 19  Temp:  37.1 C  SpO2: 94% 95%    Last Pain:  Vitals:   02/15/23 1800  TempSrc:   PainSc: 2                  Charlotte Chapman A.

## 2023-02-15 NOTE — Transfer of Care (Signed)
Immediate Anesthesia Transfer of Care Note  Patient: Charlotte Chapman  Procedure(s) Performed: OPEN REDUCTION INTERNAL FIXATION (ORIF) RIGHT DISTAL RADIUS FRACTURE (Right: Arm Lower) RIGHT BRACHIORADIALIS RELEASE (Right: Arm Lower)  Patient Location: PACU  Anesthesia Type:MAC  Level of Consciousness: awake and alert   Airway & Oxygen Therapy: Patient Spontanous Breathing  Post-op Assessment: Report given to RN and Post -op Vital signs reviewed and stable  Post vital signs: Reviewed and stable  Last Vitals:  Vitals Value Taken Time  BP 159/60 02/15/23 1730  Temp 36.4 C 02/15/23 1730  Pulse 61 02/15/23 1733  Resp 27 02/15/23 1733  SpO2 93 % 02/15/23 1733  Vitals shown include unfiled device data.  Last Pain:  Vitals:   02/15/23 1420  TempSrc:   PainSc: 0-No pain         Complications: No notable events documented.

## 2023-02-16 NOTE — Plan of Care (Signed)
CHL Tonsillectomy/Adenoidectomy, Postoperative PEDS care plan entered in error.

## 2023-02-19 ENCOUNTER — Encounter: Payer: Self-pay | Admitting: Cardiology

## 2023-02-19 DIAGNOSIS — G4733 Obstructive sleep apnea (adult) (pediatric): Secondary | ICD-10-CM

## 2023-02-19 DIAGNOSIS — G2581 Restless legs syndrome: Secondary | ICD-10-CM

## 2023-02-19 DIAGNOSIS — I10 Essential (primary) hypertension: Secondary | ICD-10-CM

## 2023-02-21 ENCOUNTER — Encounter (HOSPITAL_COMMUNITY): Payer: Self-pay | Admitting: Orthopedic Surgery

## 2023-03-04 DIAGNOSIS — G4733 Obstructive sleep apnea (adult) (pediatric): Secondary | ICD-10-CM | POA: Diagnosis not present

## 2023-03-21 ENCOUNTER — Other Ambulatory Visit: Payer: Self-pay | Admitting: Internal Medicine

## 2023-03-29 DIAGNOSIS — Q211 Atrial septal defect, unspecified: Secondary | ICD-10-CM | POA: Diagnosis not present

## 2023-03-29 DIAGNOSIS — G4733 Obstructive sleep apnea (adult) (pediatric): Secondary | ICD-10-CM | POA: Diagnosis not present

## 2023-03-29 DIAGNOSIS — Q261 Persistent left superior vena cava: Secondary | ICD-10-CM | POA: Diagnosis not present

## 2023-03-29 DIAGNOSIS — I5030 Unspecified diastolic (congestive) heart failure: Secondary | ICD-10-CM | POA: Diagnosis not present

## 2023-03-29 DIAGNOSIS — Z8774 Personal history of (corrected) congenital malformations of heart and circulatory system: Secondary | ICD-10-CM | POA: Diagnosis not present

## 2023-03-29 DIAGNOSIS — I517 Cardiomegaly: Secondary | ICD-10-CM | POA: Diagnosis not present

## 2023-03-30 ENCOUNTER — Encounter: Payer: Self-pay | Admitting: Allergy & Immunology

## 2023-04-02 ENCOUNTER — Telehealth: Payer: Self-pay

## 2023-04-02 NOTE — Telephone Encounter (Signed)
Patient called back office to return Annes call. Patient would like a call back from a nurse on what to do. Call back number is [phone]

## 2023-04-02 NOTE — Telephone Encounter (Signed)
Patient message - Since I'm still having a little bit of shortness of breath should I have supplementary oxygen.     Please advise on next steps message was forwarded to [facility] today, but needed to be sent to Dr. Marlynn Perking. I called the patient and told her we would call back once the message was reviewed by a provider.

## 2023-04-02 NOTE — Telephone Encounter (Signed)
LMOM for patient to call the [facility] clinic.

## 2023-04-03 DIAGNOSIS — G4733 Obstructive sleep apnea (adult) (pediatric): Secondary | ICD-10-CM | POA: Diagnosis not present

## 2023-04-03 NOTE — Telephone Encounter (Signed)
She has multiple reasons for shortness of breath: congential cardiology issues which she follows with cardiology and asthma.  We havent seen her since August when we stepped her up to Qvar and she was supposed to follow up in 2 months.  To be able to receive supplemental oxygen she will need to qualify for it through a 6 minute walk test which we dont perform.  At this point I recommend she schedule a follow up which is overdue to evaluate if this is from her asthma and next steps

## 2023-04-03 NOTE — Telephone Encounter (Signed)
I called the patient and she stated she has been using wixela 1 puff twice a day. She did recently have an appointment with her Cardiologist and would like to get her asthma evaluated. Patient wanted to see Dr. Dellis Anes and made an appointment for 04/05/23 at 4:15pm due to Dr. Marlynn Perking not having anything available until 04/26/22.

## 2023-04-03 NOTE — Telephone Encounter (Signed)
 We will take care of her. Thanks.

## 2023-04-05 ENCOUNTER — Ambulatory Visit: Payer: Medicaid Other | Admitting: Allergy & Immunology

## 2023-04-05 ENCOUNTER — Encounter: Payer: Self-pay | Admitting: Allergy & Immunology

## 2023-04-05 ENCOUNTER — Other Ambulatory Visit: Payer: Self-pay

## 2023-04-05 VITALS — BP 122/76 | HR 62 | Temp 97.9°F | Ht 63.0 in | Wt 199.5 lb

## 2023-04-05 DIAGNOSIS — J4530 Mild persistent asthma, uncomplicated: Secondary | ICD-10-CM | POA: Diagnosis not present

## 2023-04-05 DIAGNOSIS — J3089 Other allergic rhinitis: Secondary | ICD-10-CM

## 2023-04-05 MED ORDER — FLUTICASONE PROPIONATE 50 MCG/ACT NA SUSP: 2.0000 | Freq: Every day | NASAL | 5 refills | Status: DC

## 2023-04-05 MED ORDER — MONTELUKAST SODIUM 10 MG PO TABS: 10.0000 mg | ORAL_TABLET | Freq: Every day | ORAL | 2 refills | Status: DC

## 2023-04-05 MED ORDER — CETIRIZINE HCL 10 MG PO TABS: 10.0000 mg | ORAL_TABLET | Freq: Every day | ORAL | 1 refills | Status: DC

## 2023-04-05 NOTE — Progress Notes (Signed)
FOLLOW UP  Date of Service/Encounter:  04/05/23   Assessment:   Mild persistent asthma, uncomplicated (with stable spirometry)   Atrial septal defect    Perennial allergic rhinitis   Fully vaccinated (Moderna) - with booster  Currently out of work due to broken wrist on the right side   Plan/Recommendations:   1. Perennial allergic rhinitis - Continue with Flonase one spray per nostril daily.  - Continue with Zyrtec 10mg  daily. - Continue with Singulair 10mg  daily.  2. Mild persistent asthma, uncomplicated - Lung testing looked better today compared to the last time that we saw you.  - We do not prescribe oxygen, but I will tell you that this is never really approved unless your oxygen level is below 88% during the day.  - Your Cardiologist would know more about prescribing oxygen if needed. - Daily controller medication(s): Singulair 10mg  + Wixela 100/69mcg one puff twice daily - Rescue medications: albuterol 4 puffs every 4-6 hours as needed - Asthma control goals:  * Full participation in all desired activities (may need albuterol before activity) * Albuterol use two time or less a week on average (not counting use with activity) * Cough interfering with sleep two time or less a month * Oral steroids no more than once a year * No hospitalizations  3. Return in about 3 months (around 07/04/2023). You can have the follow up appointment with Dr. Dellis Anes or a Nurse Practicioner (our Nurse Practitioners are excellent and always have Physician oversight!).    Subjective:   Charlotte Chapman is a 52 y.o. 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  Discussed the use of AI scribe software for clinical note transcription with the patient and/or guardian, who gave verbal consent to proceed.  Charlotte Chapman is a 52 y.o. female presenting for a follow up visit.  She was last seen in August 2024 by Dr. Marlynn Perking. At that time, we continue with Flonase as well as Zyrtec and Singulair.  Her lung testing showed an exacerbation.  She was continued on Singulair as well as Qvar 2 puffs twice daily.  Since last visit, she has largely done well.    Charlotte Chapman presents with concerns of increasing shortness of breath. She reports that the shortness of breath has been worsening, but also notes decreased activity due to a recent wrist injury. The patient is currently on Wixela for asthma management, which she reports as being effective 'for the most part.' She has not noticed any significant side effects from the [place], such as thrush or throat irritation, but does report dry mouth.  The patient also has a history of  a congenital heart defect, specifically an atrial septal defect, which was repaired in 1975. She has been under the care of cardiology since birth and has an upcoming cardiac catheterization scheduled due to recent changes in her heart and the worsening shortness of breath.  The patient also raised a question about the potential need for supplemental oxygen, noting that her oxygen levels have previously dropped to around 95-97%. However, she also  acknowledges that this fluctuates and she has not seen it drop below 88%. She does not really monitor it at night. She has not talked to her Cardiology Team about this concern. She tells me that her sister encouraged her to ask about it. She has never been on oxygen in the past.   Overall, the patient's primary concerns are the increasing shortness of breath and the upcoming cardiac catheterization. She is also dealing with the recent wrist injury and its impact on her daily activities.   Asthma/Respiratory Symptom History: She is now on Wixela 100/5 mcg one puff BID. She is having some SOB, but she thinks that this might be related to her cardiac issues.  She has been seeing Cardiology since she has been born. She his going to get an echocardiogram and a cath procedure on December 30th. She has not been using her albuterol routinely at all.   Allergic Rhinitis Symptom History: She remains on her fluticasone as well as the cetirizine. She is also on the montelukast daily. She has not been on antibiotics for any breakthrough sinus infections at all .  In addition to these issues, the patient recently sustained a wrist injury at work, resulting in a fracture that required surgical intervention with placement of pins and a plate. This has led to a period of decreased activity and time off work. She was working at [employer] and this is typically a busy time of the year for them.   Otherwise, there have been no changes to her past medical history, surgical history, family history, or social history.    Review of systems otherwise negative other than that mentioned in the HPI.    Objective:   Blood pressure 122/76, pulse 62, temperature 97.9 F (36.6 C), temperature source Temporal, height 5\' 3"  (1.6 m), weight 199 lb 8 oz (90.5 kg), SpO2 97%. Body mass index is 35.34 kg/m.    Physical Exam Vitals reviewed.  Constitutional:      Appearance: She is well-developed.     Comments: Pleasant.  HENT:      Head: Normocephalic and atraumatic.     Right Ear: Tympanic membrane, ear canal and external ear normal.     Left Ear: Tympanic membrane, ear canal and external ear normal.     Nose: No nasal deformity, septal deviation, mucosal edema or rhinorrhea.     Right Turbinates: Enlarged, swollen and pale.     Left Turbinates: Enlarged, swollen and pale.     Right Sinus: No maxillary sinus tenderness or frontal sinus tenderness.     Left Sinus: No maxillary sinus tenderness or frontal sinus tenderness.     Mouth/Throat:     Lips: Pink.     Mouth: Mucous membranes are moist. Mucous membranes are not pale and not dry.     Pharynx: Uvula midline.  Eyes:     General: Lids are normal. Allergic shiner present.        Right eye: No discharge.        Left eye: No discharge.     Conjunctiva/sclera: Conjunctivae normal.  Right eye: Right conjunctiva is not injected. No chemosis.    Left eye: Left conjunctiva is not injected. No chemosis.    Pupils: Pupils are equal, round, and reactive to light.  Cardiovascular:     Rate and Rhythm: Normal rate and regular rhythm.     Heart sounds: Normal heart sounds.  Pulmonary:     Effort: Pulmonary effort is normal. No tachypnea, accessory muscle usage or respiratory distress.     Breath sounds: No decreased breath sounds, wheezing, rhonchi or rales.     Comments: Moving air well.  Chest:     Chest wall: No tenderness.  Lymphadenopathy:     Cervical: No cervical adenopathy.  Skin:    General: Skin is warm.     Capillary Refill: Capillary refill takes less than 2 seconds.     Coloration: Skin is not pale.     Findings: No abrasion, erythema, petechiae or rash. Rash is not papular, urticarial or vesicular.     Comments: No eczematous or urticarial lesions noted.  Neurological:     Mental Status: She is alert.  Psychiatric:        Behavior: Behavior is cooperative.      Diagnostic studies:    Spirometry: results abnormal (FEV1: 1.95/76%, FVC:  2.31/73%, FEV1/FVC: 84%).    Spirometry consistent with possible restrictive disease.    Allergy Studies: none        Malachi Bonds, MD  [facility]

## 2023-04-05 NOTE — Patient Instructions (Addendum)
1. Perennial allergic rhinitis - Continue with Flonase one spray per nostril daily.  - Continue with Zyrtec 10mg  daily. - Continue with Singulair 10mg  daily.  2. Mild persistent asthma, uncomplicated - Lung testing looked better today compared to the last time that we saw you.  - We do not prescribe oxygen, but I will tell you that this is never really approved unless your oxygen level is below 88% during the day.  - Your Cardiologist would know more about prescribing oxygen if needed. - Daily controller medication(s): Singulair 10mg  + Wixela 100/25mcg one puff twice daily - Rescue medications: albuterol 4 puffs every 4-6 hours as needed - Asthma control goals:  * Full participation in all desired activities (may need albuterol before activity) * Albuterol use two time or less a week on average (not counting use with activity) * Cough interfering with sleep two time or less a month * Oral steroids no more than once a year * No hospitalizations  3. Return in about 3 months (around 07/04/2023). You can have the follow up appointment with Dr. Dellis Anes or a Nurse Practicioner (our Nurse Practitioners are excellent and always have Physician oversight!).    Please inform us of any Emergency Department visits, hospitalizations, or changes in symptoms. Call us before going to the ED for breathing or allergy symptoms since we might be able to fit you in for a sick visit. Feel free to contact us anytime with any questions, problems, or concerns.  It was a pleasure to see you again today! Happy thoughts for the procedure at the end of the month!   Websites that have reliable patient information: 1. American Academy of Asthma, Allergy, and Immunology: www.aaaai.org 2. Food Allergy Research and Education (FARE): foodallergy.org 3. Mothers of Asthmatics: http://www.asthmacommunitynetwork.org 4. American College of Allergy, Asthma, and Immunology: www.acaai.org      "Like" [place] on Facebook and  Instagram for our latest updates!      A healthy democracy works best when [employer] participate! Make sure you are registered to vote! If you have moved or changed any of your contact information, you will need to get this updated before voting! Scan the QR codes below to learn more!

## 2023-04-16 DIAGNOSIS — Q211 Atrial septal defect, unspecified: Secondary | ICD-10-CM | POA: Diagnosis not present

## 2023-04-16 DIAGNOSIS — I5030 Unspecified diastolic (congestive) heart failure: Secondary | ICD-10-CM | POA: Diagnosis not present

## 2023-04-16 DIAGNOSIS — I509 Heart failure, unspecified: Secondary | ICD-10-CM | POA: Diagnosis not present

## 2023-04-26 DIAGNOSIS — F411 Generalized anxiety disorder: Secondary | ICD-10-CM | POA: Diagnosis not present

## 2023-04-26 DIAGNOSIS — E669 Obesity, unspecified: Secondary | ICD-10-CM | POA: Diagnosis not present

## 2023-04-26 DIAGNOSIS — I5032 Chronic diastolic (congestive) heart failure: Secondary | ICD-10-CM | POA: Diagnosis not present

## 2023-04-26 DIAGNOSIS — F4011 Social phobia, generalized: Secondary | ICD-10-CM | POA: Diagnosis not present

## 2023-04-26 DIAGNOSIS — J4530 Mild persistent asthma, uncomplicated: Secondary | ICD-10-CM | POA: Diagnosis not present

## 2023-04-26 DIAGNOSIS — G4733 Obstructive sleep apnea (adult) (pediatric): Secondary | ICD-10-CM | POA: Diagnosis not present

## 2023-04-26 DIAGNOSIS — G2581 Restless legs syndrome: Secondary | ICD-10-CM | POA: Diagnosis not present

## 2023-04-26 DIAGNOSIS — M8000XD Age-related osteoporosis with current pathological fracture, unspecified site, subsequent encounter for fracture with routine healing: Secondary | ICD-10-CM | POA: Diagnosis not present

## 2023-04-26 DIAGNOSIS — S62101D Fracture of unspecified carpal bone, right wrist, subsequent encounter for fracture with routine healing: Secondary | ICD-10-CM | POA: Diagnosis not present

## 2023-05-02 ENCOUNTER — Other Ambulatory Visit: Payer: Self-pay | Admitting: Family Medicine

## 2023-05-02 DIAGNOSIS — Z780 Asymptomatic menopausal state: Secondary | ICD-10-CM

## 2023-05-03 ENCOUNTER — Telehealth: Payer: Self-pay | Admitting: Cardiology

## 2023-05-03 NOTE — Telephone Encounter (Signed)
FYI--Patient states she does not want to continue being followed by Dr. Mayford Knife at this time. Recall removed from the system.

## 2023-05-04 DIAGNOSIS — G4733 Obstructive sleep apnea (adult) (pediatric): Secondary | ICD-10-CM | POA: Diagnosis not present

## 2023-05-08 NOTE — Telephone Encounter (Signed)
Call to patient to see if she has any concerns about continuing care at [facility]. Patient states that she saw Dr. Deatra Chapman as Dr. Mayford Knife ordered and feels she needs to continue her follow up  care with him. Advised patient to give [place] a call if she ever needs anything in the future.

## 2023-05-10 NOTE — Telephone Encounter (Signed)
Call to patient to advise that Dr. Mayford Knife is ok with her continuing her cardiology care with Dr. Deatra James but will continue to treat her for OSA. Advised that patient is due for 1 yr f/u in May 2025, schedule is open so I made patient an appt for 09/03/23 at 11 AM and left message advising of this 1 yr f/u for OSA. Asked patient to call us back if this appt needs to be changed because she is being seen elsewhere for OSA or if this day/time will not work for her.

## 2023-05-14 ENCOUNTER — Encounter: Payer: Self-pay | Admitting: Cardiology

## 2023-05-18 ENCOUNTER — Other Ambulatory Visit: Payer: Self-pay | Admitting: Obstetrics and Gynecology

## 2023-05-18 DIAGNOSIS — Z1231 Encounter for screening mammogram for malignant neoplasm of breast: Secondary | ICD-10-CM

## 2023-05-21 DIAGNOSIS — G4733 Obstructive sleep apnea (adult) (pediatric): Secondary | ICD-10-CM | POA: Diagnosis not present

## 2023-05-31 DIAGNOSIS — Z01812 Encounter for preprocedural laboratory examination: Secondary | ICD-10-CM | POA: Diagnosis not present

## 2023-05-31 DIAGNOSIS — Z8774 Personal history of (corrected) congenital malformations of heart and circulatory system: Secondary | ICD-10-CM | POA: Diagnosis not present

## 2023-05-31 DIAGNOSIS — Q261 Persistent left superior vena cava: Secondary | ICD-10-CM | POA: Diagnosis not present

## 2023-05-31 DIAGNOSIS — I5032 Chronic diastolic (congestive) heart failure: Secondary | ICD-10-CM | POA: Diagnosis not present

## 2023-06-01 LAB — BASIC METABOLIC PANEL
BUN/Creatinine Ratio: 25 — ABNORMAL HIGH (ref 9–23)
BUN: 28 mg/dL — ABNORMAL HIGH (ref 6–24)
CO2: 25 mmol/L (ref 20–29)
Calcium: 10.4 mg/dL — ABNORMAL HIGH (ref 8.7–10.2)
Chloride: 102 mmol/L (ref 96–106)
Creatinine, Ser: 1.1 mg/dL — ABNORMAL HIGH (ref 0.57–1.00)
Glucose: 101 mg/dL — ABNORMAL HIGH (ref 70–99)
Potassium: 4.4 mmol/L (ref 3.5–5.2)
Sodium: 142 mmol/L (ref 134–144)
eGFR: 61 mL/min/{1.73_m2} (ref >59)

## 2023-06-25 DIAGNOSIS — G4733 Obstructive sleep apnea (adult) (pediatric): Secondary | ICD-10-CM | POA: Diagnosis not present

## 2023-06-26 DIAGNOSIS — J0101 Acute recurrent maxillary sinusitis: Secondary | ICD-10-CM | POA: Diagnosis not present

## 2023-06-26 DIAGNOSIS — B3731 Acute candidiasis of vulva and vagina: Secondary | ICD-10-CM | POA: Diagnosis not present

## 2023-06-26 DIAGNOSIS — Z6836 Body mass index (BMI) 36.0-36.9, adult: Secondary | ICD-10-CM | POA: Diagnosis not present

## 2023-06-29 ENCOUNTER — Encounter: Payer: Self-pay | Admitting: Obstetrics and Gynecology

## 2023-06-29 DIAGNOSIS — Z1231 Encounter for screening mammogram for malignant neoplasm of breast: Secondary | ICD-10-CM

## 2023-07-11 DIAGNOSIS — G4733 Obstructive sleep apnea (adult) (pediatric): Secondary | ICD-10-CM | POA: Diagnosis not present

## 2023-07-12 ENCOUNTER — Encounter: Payer: Self-pay | Admitting: Allergy & Immunology

## 2023-07-12 ENCOUNTER — Other Ambulatory Visit: Payer: Self-pay | Admitting: Allergy & Immunology

## 2023-07-12 ENCOUNTER — Ambulatory Visit: Payer: Medicaid Other | Admitting: Allergy & Immunology

## 2023-07-12 ENCOUNTER — Other Ambulatory Visit: Payer: Self-pay

## 2023-07-12 VITALS — BP 118/70 | HR 72 | Temp 98.1°F | Resp 12

## 2023-07-12 DIAGNOSIS — J3089 Other allergic rhinitis: Secondary | ICD-10-CM

## 2023-07-12 DIAGNOSIS — J4530 Mild persistent asthma, uncomplicated: Secondary | ICD-10-CM | POA: Diagnosis not present

## 2023-07-12 MED ORDER — FLUTICASONE PROPIONATE 50 MCG/ACT NA SUSP: 2.0000 | Freq: Every day | NASAL | 5 refills | Status: DC

## 2023-07-12 MED ORDER — MONTELUKAST SODIUM 10 MG PO TABS: 10.0000 mg | ORAL_TABLET | Freq: Every day | ORAL | 1 refills | Status: DC

## 2023-07-12 MED ORDER — FLUTICASONE-SALMETEROL 250-50 MCG/ACT IN AEPB: 1.0000 | INHALATION_SPRAY | Freq: Two times a day (BID) | RESPIRATORY_TRACT | 5 refills | Status: DC

## 2023-07-12 MED ORDER — CETIRIZINE HCL 10 MG PO TABS: 10.0000 mg | ORAL_TABLET | Freq: Every day | ORAL | 1 refills | Status: DC

## 2023-07-12 NOTE — Patient Instructions (Addendum)
 1. Perennial allergic rhinitis - Continue with Flonase one spray per nostril daily.  - Continue with Zyrtec 10mg  daily. - Continue with Singulair 10mg  daily.  - Nasal saline rinse Jocilyn provided today.   2. Mild persistent asthma, uncomplicated - Lung testing looked better today continues to look good today.  - We are going to increase your Wixela to a stronger dose (250/50) to see if this helps decrease your albuterol use. - We can do FMLA paperwork if need [place] to do that.  - Daily controller medication(s): Singulair 10mg  + Wixela 250/50 mcg one puff twice daily - Rescue medications: albuterol 4 puffs every 4-6 hours as needed - Asthma control goals:  * Full participation in all desired activities (may need albuterol before activity) * Albuterol use two time or less a week on average (not counting use with activity) * Cough interfering with sleep two time or less a month * Oral steroids no more than once a year * No hospitalizations  3. Return in about 3 months (around 10/12/2023). You can have the follow up appointment with Dr. Dellis Anes or a Nurse Practicioner (our Nurse Practitioners are excellent and always have Physician oversight!).    Please inform us of any Emergency Department visits, hospitalizations, or changes in symptoms. Call us before going to the ED for breathing or allergy symptoms since we might be able to fit you in for a sick visit. Feel free to contact us anytime with any questions, problems, or concerns.  It was a pleasure to see you again today! Happy thoughts for the procedure at the end of the month!   Websites that have reliable patient information: 1. American Academy of Asthma, Allergy, and Immunology: www.aaaai.org 2. Food Allergy Research and Education (FARE): foodallergy.org 3. Mothers of Asthmatics: http://www.asthmacommunitynetwork.org 4. American College of Allergy, Asthma, and Immunology: www.acaai.org      "Like" [place] on Facebook and Instagram for  our latest updates!      A healthy democracy works best when [employer] participate! Make sure you are registered to vote! If you have moved or changed any of your contact information, you will need to get this updated before voting! Scan the QR codes below to learn more!

## 2023-07-12 NOTE — Progress Notes (Unsigned)
 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 Considered increasing Wixela due to symptoms. Trelegy not chosen due to cardiac effects and stiff heart syndrome. - Increase Wixela dose. - Continue Advair and albuterol as needed.  Sinus Congestion Recent sinus congestion post-infection, treated with antibiotics. - Encourage regular saline rinses.  Allergies Fish and shellfish allergies managed with Zyrtec and nasal spray. No new allergies since last testing. - Continue Zyrtec and nasal spray.  Stiff Heart Syndrome Affects cardiac efficiency, managed with diuretics. Identified post-catheterization. Cardiologist follow-up scheduled. - Continue Jardiance, spironolactone, and torsemide. - Follow up with cardiologist in September.  Sleep Apnea Managed with CPAP machine. Recently changed machines for better management. - Continue using CPAP machine.  Restless Legs Syndrome Managed with ropinirole, taken earlier for effectiveness. - Continue ropinirole, taking it earlier in the evening.         Patient Instructions  1. Perennial allergic rhinitis - Continue with Flonase one spray per nostril daily.  - Continue with Zyrtec 10mg  daily. - Continue with Singulair 10mg  daily.  - Nasal saline rinse [place] provided today.   2. Mild persistent asthma, uncomplicated - Lung testing looked better today continues to look good today.  - We are going to increase your Wixela to a stronger dose (250/50) to see if this helps decrease your albuterol use. - We can do FMLA paperwork if need [place] to do that.  - Daily controller medication(s): Singulair 10mg  + Wixela 250/50 mcg  one puff twice daily - Rescue medications: albuterol 4 puffs every 4-6 hours as needed - Asthma control goals:  * Full participation in all desired activities (may need albuterol before activity) * Albuterol use two time or less a week on average (not counting use with activity) * Cough interfering with sleep two time or less a month * Oral steroids no more than once a year * No hospitalizations  3. Return in about 3 months (around 10/12/2023). You can have the follow up appointment with Dr. Dellis Anes or a Nurse Practicioner (our Nurse Practitioners are excellent and always have Physician oversight!).    Please inform us of any Emergency Department visits, hospitalizations, or changes in symptoms. Call us before going to the ED for breathing or allergy symptoms since we might be able to fit you in for a sick visit. Feel free to contact us anytime with any questions, problems, or concerns.  It was a pleasure to see you again today! Happy thoughts for the procedure at the end of the month!   Websites that have reliable patient information: 1. American Academy of Asthma, Allergy, and Immunology: www.aaaai.org 2. Food Allergy Research and Education (FARE): foodallergy.org 3. Mothers of Asthmatics: http://www.asthmacommunitynetwork.org 4. American College of Allergy, Asthma, and Immunology: www.acaai.org      "Like" [place] on Facebook and Instagram for our latest updates!      A healthy democracy works best when [employer] participate! Make sure you are registered to vote! If you have moved or changed any of your contact information, you will need to get this updated before voting! Scan the QR codes below to learn more!  Subjective:   Amyria Crownover is a 53 y.o. 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  Discussed the use of AI scribe software for clinical note transcription with the patient and/or guardian, who gave verbal consent to proceed.  Swayzie is a 53 y.o. female presenting for {Blank single:19197::"a food challenge","a drug challenge","skin testing","a sick visit","an evaluation of ***","a follow up visit"}.  She was last seen in December 2024.  At that time, we continue with Flonase as well as Zyrtec and Singulair.  Lung testing looked a little bit better.  We continue with Singulair as well as Wixela 100/50 mcg 1 puff once daily.  She was wondering about oxygen, but I recommended that she talk to her cardiologist.  Discussed the use of AI scribe software for clinical note transcription with the patient, who gave verbal consent to proceed.  History of Present Illness   Krissia Drone is a 53 year old female with asthma and stiff heart syndrome who presents for follow-up of her respiratory and cardiac conditions.  She manages her asthma with Advair and Chloraertanase, using albuterol as needed, though infrequently. She experiences congestion, which she attributes to a recent sinus infection treated with antibiotics. She also uses a nasal spray and Zyrtec for her symptoms. She has not used Trelegy before.  She has a  history of stiff heart syndrome, diagnosed after an ASD repair in childhood. She reports calcium buildup and is currently on Jardiance, spironolactone, and torsemide for management. She had a comprehensive cardiac workup, including a catheterization, around New Year's Eve. Her next echocardiogram is scheduled for September.  She has a history of sleep apnea and has recently changed her CPAP machine. There are no new symptoms or issues with the current management.  She takes ropinirole for restless legs syndrome, which she finds more effective when taken earlier in the evening.  She has a history of sinus infections and uses nasal sprays and Zyrtec for allergy management. There is no recent allergy testing, but she reports no change in her allergy status. Her current medications include citalopram, iron, calcium, hydroxyzine, lysine, montelukast, potassium, and Requip for restless legs syndrome.        Asthma/Respiratory Symptom History: ***  Cardiology in Feb 2025: added Jardiance (empagliflozin) 10 mg once a day to your medication regimen to help your elevated heart relaxation pressures. Please continue torsemide 20 mg once a day and spironolactone 25 mg once a day.   Allergic Rhinitis Symptom History: ***  Food Allergy Symptom History: ***  Skin Symptom History: ***  GERD Symptom History: ***  Infection Symptom History: ***  She is working at the [employer]. She does not really  like it. She has been back to full time since last week. She is doing light duty and she is bored.   Otherwise, there have been no changes to her past medical history, surgical history, family history, or social history.    Review of systems otherwise negative other than that mentioned in the HPI.    Objective:   Blood pressure 118/70, pulse 72, temperature 98.1 F (36.7 C), temperature source Temporal, resp. rate 12, SpO2 95%. There is no height or weight on file to calculate  BMI.    Physical Exam   Diagnostic studies:    Spirometry: results normal (FEV1: 2.05/81%, FVC: 2.51/79%, FEV1/FVC: 82%).    Spirometry consistent with normal pattern. {Blank single:19197::"Albuterol/Atrovent nebulizer","Xopenex/Atrovent nebulizer","Albuterol nebulizer","Albuterol four puffs via MDI","Xopenex four puffs via MDI"} treatment given in clinic with {Blank single:19197::"significant improvement in FEV1 per ATS criteria","significant improvement in FVC per ATS criteria","significant improvement in FEV1 and FVC per ATS criteria","improvement in FEV1, but not significant per ATS criteria","improvement in FVC, but not significant per ATS criteria","improvement in FEV1 and FVC, but not significant per ATS criteria","no improvement"}.  Allergy Studies: {Blank single:19197::"none","deferred due to recent antihistamine use","deferred due to insurance stipulations that require a separate visit for testing","labs sent instead"," "}    {Blank single:19197::"Allergy testing results were read and interpreted by myself, documented by clinical staff."," "}      Malachi Bonds, MD  [facility]

## 2023-07-13 ENCOUNTER — Encounter: Payer: Self-pay | Admitting: Allergy & Immunology

## 2023-07-13 NOTE — Telephone Encounter (Signed)
 Insurance preferring Symbicort?

## 2023-07-26 DIAGNOSIS — G4733 Obstructive sleep apnea (adult) (pediatric): Secondary | ICD-10-CM | POA: Diagnosis not present

## 2023-08-25 DIAGNOSIS — G4733 Obstructive sleep apnea (adult) (pediatric): Secondary | ICD-10-CM | POA: Diagnosis not present

## 2023-08-27 DIAGNOSIS — G2581 Restless legs syndrome: Secondary | ICD-10-CM | POA: Diagnosis not present

## 2023-08-27 DIAGNOSIS — G4733 Obstructive sleep apnea (adult) (pediatric): Secondary | ICD-10-CM | POA: Diagnosis not present

## 2023-09-03 ENCOUNTER — Ambulatory Visit: Payer: Medicaid Other | Admitting: Cardiology

## 2023-09-13 ENCOUNTER — Ambulatory Visit
Admission: RE | Admit: 2023-09-13 | Discharge: 2023-09-13 | Disposition: A | Discharge status: Home / Self Care | Payer: Medicaid Other | Source: Ambulatory Visit | Attending: Obstetrics and Gynecology | Admitting: Obstetrics and Gynecology

## 2023-09-13 DIAGNOSIS — Z1231 Encounter for screening mammogram for malignant neoplasm of breast: Secondary | ICD-10-CM

## 2023-09-25 ENCOUNTER — Other Ambulatory Visit: Payer: Self-pay | Admitting: Family Medicine

## 2023-09-25 DIAGNOSIS — Z780 Asymptomatic menopausal state: Secondary | ICD-10-CM

## 2023-09-25 DIAGNOSIS — G4733 Obstructive sleep apnea (adult) (pediatric): Secondary | ICD-10-CM | POA: Diagnosis not present

## 2023-10-09 ENCOUNTER — Ambulatory Visit: Admitting: Allergy & Immunology

## 2023-10-09 ENCOUNTER — Other Ambulatory Visit: Payer: Self-pay

## 2023-10-09 VITALS — BP 110/70 | HR 60 | Temp 97.3°F | Resp 16 | Ht 62.21 in | Wt 193.3 lb

## 2023-10-09 DIAGNOSIS — J4530 Mild persistent asthma, uncomplicated: Secondary | ICD-10-CM

## 2023-10-09 DIAGNOSIS — J3089 Other allergic rhinitis: Secondary | ICD-10-CM | POA: Diagnosis not present

## 2023-10-09 DIAGNOSIS — I5032 Chronic diastolic (congestive) heart failure: Secondary | ICD-10-CM

## 2023-10-09 DIAGNOSIS — Q249 Congenital malformation of heart, unspecified: Secondary | ICD-10-CM

## 2023-10-09 DIAGNOSIS — G4733 Obstructive sleep apnea (adult) (pediatric): Secondary | ICD-10-CM

## 2023-10-09 DIAGNOSIS — F411 Generalized anxiety disorder: Secondary | ICD-10-CM

## 2023-10-09 MED ORDER — ALBUTEROL SULFATE HFA 108 (90 BASE) MCG/ACT IN AERS: INHALATION_SPRAY | RESPIRATORY_TRACT | 1 refills | Status: DC

## 2023-10-09 MED ORDER — FLUTICASONE PROPIONATE 50 MCG/ACT NA SUSP: 2.0000 | Freq: Every day | NASAL | 5 refills | Status: AC

## 2023-10-09 MED ORDER — MONTELUKAST SODIUM 10 MG PO TABS: 10.0000 mg | ORAL_TABLET | Freq: Every day | ORAL | 1 refills | Status: AC

## 2023-10-09 MED ORDER — BUDESONIDE-FORMOTEROL FUMARATE 160-4.5 MCG/ACT IN AERO: 2.0000 | INHALATION_SPRAY | Freq: Two times a day (BID) | RESPIRATORY_TRACT | 5 refills | Status: AC

## 2023-10-09 MED ORDER — CETIRIZINE HCL 10 MG PO TABS: 10.0000 mg | ORAL_TABLET | Freq: Every day | ORAL | 1 refills | Status: AC

## 2023-10-09 NOTE — Progress Notes (Unsigned)
 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 Perennial allergic rhinitis - Continue with Flonase  one spray per nostril daily.  - Continue with Zyrtec  10mg  daily. - Continue with Singulair  10mg  daily.   2. Mild persistent asthma, uncomplicated - Lung testing continues to improve. - We are not going to make any changes at this time.  - Daily controller medication(s): Singulair  10mg  + Symbicort 160/4.62mcg two puffs twice daily with spacer  - Rescue medications: albuterol  4 puffs every 4-6 hours as needed - Asthma control goals:  * Full participation in all desired activities (may need albuterol  before activity) * Albuterol  use two time or less a week on average (not counting use with activity) * Cough interfering with sleep two time or less a month * Oral steroids no more than once a year * No hospitalizations  3. Return in about 6 months (around 04/09/2024). You can have the follow up appointment with Dr. Iva or a Nurse Practicioner (our Nurse Practitioners are excellent and always have Physician oversight!).    Please inform us  of any Emergency Department visits, hospitalizations, or changes in symptoms. Call us  before going to the ED for breathing or allergy symptoms since we might be able to fit you in for a sick visit. Feel free to contact us  anytime with any questions, problems, or concerns.  It was a pleasure to see you again today!     Websites that have reliable patient information: 1. American Academy of Asthma, Allergy, and Immunology: www.aaaai.org 2. Food Allergy Research and Education (FARE): foodallergy.org 3. Mothers of Asthmatics: http://www.asthmacommunitynetwork.org 4.  American College of Allergy, Asthma, and Immunology: www.acaai.org      "Like" us  on Facebook and Instagram for our latest updates!      A healthy democracy works best when [employer] participate! Make sure you are registered to vote! If you have moved or changed any of your contact information, you will need to get this updated before voting! Scan the QR codes below to learn more!           Subjective:   Charlotte Chapman is a 53 y.o. 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  Discussed the use of AI scribe software for clinical note transcription with the patient and/or guardian, who gave verbal consent to proceed.  Charlotte Chapman is a 53 y.o. female presenting for {Blank single:19197::a  food challenge,a drug challenge,skin testing,a sick visit,an evaluation of ***,a follow up visit}.  She was last seen in March 2025.  At that time, we continue with Flonase  and Zyrtec  as well as Singulair .  Her lung testing looked a little bit better.  We increased her controller to 250/50 mcg 1 puff twice daily.  We also continue with Singulair   10 mg daily.  Since last visit,  Asthma/Respiratory Symptom History: ***  Allergic Rhinitis Symptom History: ***  Food Allergy Symptom History: ***  Skin Symptom History: ***  GERD Symptom History: ***  Infection Symptom History: ***  Otherwise, there have been no changes to her past medical history, surgical history, family history, or social history.    Review of systems otherwise negative other than that mentioned in the HPI.    Objective:   Blood pressure 110/70, pulse 60, temperature (!) 97.3 F (36.3 C), resp. rate 16, height 5' 2.21 (1.58 m), weight 193 lb 4.8 oz (87.7 kg), SpO2 97%. Body mass index is 35.12 kg/m.    Physical Exam Vitals reviewed.  Constitutional:      Appearance: She is well-developed.     Comments: Pleasant.  HENT:     Head: Normocephalic and atraumatic.     Right Ear: Tympanic membrane, ear canal and external ear normal.     Left Ear: Tympanic membrane, ear canal and external ear normal.     Nose: No nasal deformity, septal deviation, mucosal edema or rhinorrhea.     Right Turbinates: Enlarged, swollen and pale.     Left Turbinates: Enlarged, swollen and pale.     Right Sinus: No maxillary sinus tenderness or frontal sinus tenderness.     Left Sinus: No maxillary sinus tenderness or frontal sinus tenderness.     Mouth/Throat:     Lips: Pink.     Mouth: Mucous membranes are moist. Mucous membranes are not pale and not dry.     Pharynx: Uvula midline.   Eyes:     General: Lids are normal. Allergic shiner present.        Right eye: No discharge.        Left eye: No discharge.     Conjunctiva/sclera: Conjunctivae normal.     Right eye: Right conjunctiva is not injected. No chemosis.    Left eye: Left conjunctiva is not injected. No chemosis.    Pupils: Pupils are equal, round, and reactive to light.    Cardiovascular:     Rate and Rhythm: Normal rate and regular rhythm.     Heart sounds: Normal heart sounds.  Pulmonary:      Effort: Pulmonary effort is normal. No tachypnea, accessory muscle usage or respiratory distress.     Breath sounds: No decreased breath sounds, wheezing, rhonchi or rales.     Comments: Moving air well.  Chest:     Chest wall: No tenderness.  Lymphadenopathy:     Cervical: No cervical adenopathy.   Skin:    General: Skin is warm.     Capillary Refill: Capillary refill takes less than 2 seconds.     Coloration: Skin is not pale.     Findings: No abrasion, erythema, petechiae or rash. Rash is not papular, urticarial or vesicular.     Comments: No eczematous or urticarial lesions noted.   Neurological:     Mental Status: She is alert.   Psychiatric:        Behavior: Behavior is cooperative.      Diagnostic studies:    Spirometry:  results normal (FEV1: 1.82/72%, FVC: 2.19/69%, FEV1/FVC: 83%).    Spirometry consistent with normal pattern.    Allergy Studies: none       Marty Shaggy, MD  [facility] 

## 2023-10-09 NOTE — Progress Notes (Unsigned)
 Charlotte Chapman

## 2023-10-09 NOTE — Patient Instructions (Addendum)
 1. Perennial allergic rhinitis - Continue with Flonase  one spray per nostril daily.  - Continue with Zyrtec  10mg  daily. - Continue with Singulair  10mg  daily.   2. Mild persistent asthma, uncomplicated - Lung testing continues to improve. - We are not going to make any changes at this time.  - Daily controller medication(s): Singulair  10mg  + Symbicort 160/4.76mcg two puffs twice daily with spacer  - Rescue medications: albuterol  4 puffs every 4-6 hours as needed - Asthma control goals:  * Full participation in all desired activities (may need albuterol  before activity) * Albuterol  use two time or less a week on average (not counting use with activity) * Cough interfering with sleep two time or less a month * Oral steroids no more than once a year * No hospitalizations  3. Return in about 6 months (around 04/09/2024). You can have the follow up appointment with Dr. Iva or a Nurse Practicioner (our Nurse Practitioners are excellent and always have Physician oversight!).    Please inform us  of any Emergency Department visits, hospitalizations, or changes in symptoms. Call us  before going to the ED for breathing or allergy symptoms since we might be able to fit you in for a sick visit. Feel free to contact us  anytime with any questions, problems, or concerns.  It was a pleasure to see you again today!     Websites that have reliable patient information: 1. American Academy of Asthma, Allergy, and Immunology: www.aaaai.org 2. Food Allergy Research and Education (FARE): foodallergy.org 3. Mothers of Asthmatics: http://www.asthmacommunitynetwork.org 4. American College of Allergy, Asthma, and Immunology: www.acaai.org      "Like" us  on Facebook and Instagram for our latest updates!      A healthy democracy works best when [employer] participate! Make sure you are registered to vote! If you have moved or changed any of your contact information, you will need to get this updated  before voting! Scan the QR codes below to learn more!

## 2023-10-11 ENCOUNTER — Encounter: Payer: Self-pay | Admitting: Allergy & Immunology

## 2023-10-25 DIAGNOSIS — Z23 Encounter for immunization: Secondary | ICD-10-CM | POA: Diagnosis not present

## 2023-10-25 DIAGNOSIS — Z6835 Body mass index (BMI) 35.0-35.9, adult: Secondary | ICD-10-CM | POA: Diagnosis not present

## 2023-10-25 DIAGNOSIS — G4733 Obstructive sleep apnea (adult) (pediatric): Secondary | ICD-10-CM | POA: Diagnosis not present

## 2023-10-25 DIAGNOSIS — I5032 Chronic diastolic (congestive) heart failure: Secondary | ICD-10-CM | POA: Diagnosis not present

## 2023-10-25 DIAGNOSIS — W57XXXA Bitten or stung by nonvenomous insect and other nonvenomous arthropods, initial encounter: Secondary | ICD-10-CM | POA: Diagnosis not present

## 2023-10-25 DIAGNOSIS — G2581 Restless legs syndrome: Secondary | ICD-10-CM | POA: Diagnosis not present

## 2023-10-25 DIAGNOSIS — F411 Generalized anxiety disorder: Secondary | ICD-10-CM | POA: Diagnosis not present

## 2023-10-25 DIAGNOSIS — S70261A Insect bite (nonvenomous), right hip, initial encounter: Secondary | ICD-10-CM | POA: Diagnosis not present

## 2023-11-21 ENCOUNTER — Ambulatory Visit

## 2023-11-21 DIAGNOSIS — Z13820 Encounter for screening for osteoporosis: Secondary | ICD-10-CM | POA: Diagnosis not present

## 2023-11-21 DIAGNOSIS — M8589 Other specified disorders of bone density and structure, multiple sites: Secondary | ICD-10-CM | POA: Diagnosis not present

## 2023-11-21 DIAGNOSIS — Z780 Asymptomatic menopausal state: Secondary | ICD-10-CM | POA: Diagnosis not present

## 2023-12-25 DIAGNOSIS — G4733 Obstructive sleep apnea (adult) (pediatric): Secondary | ICD-10-CM | POA: Diagnosis not present

## 2023-12-27 DIAGNOSIS — I5032 Chronic diastolic (congestive) heart failure: Secondary | ICD-10-CM | POA: Diagnosis not present

## 2023-12-27 DIAGNOSIS — Z8774 Personal history of (corrected) congenital malformations of heart and circulatory system: Secondary | ICD-10-CM | POA: Diagnosis not present

## 2024-01-03 ENCOUNTER — Other Ambulatory Visit: Payer: Medicaid Other

## 2024-03-10 ENCOUNTER — Other Ambulatory Visit: Payer: Self-pay | Admitting: *Deleted

## 2024-03-10 MED ORDER — ALBUTEROL SULFATE HFA 108 (90 BASE) MCG/ACT IN AERS: INHALATION_SPRAY | RESPIRATORY_TRACT | 0 refills | Status: AC

## 2024-03-16 NOTE — Progress Notes (Unsigned)
   [address] [place] [address] Dept: [phone]  FOLLOW UP NOTE  Patient ID: Charlotte Chapman, female    DOB: [birth year]  Age: 53 y.o. MRN: [id number] Date of Office Visit: 03/17/2024  Assessment  Chief Complaint: No chief complaint on file.  HPI Charlotte Chapman is a 53 year old female who presents to the clinic for a follow up visit. She was last seen in this lcinic on 09/19/2023 by Dr. Iva for evaluation of asthma, allergic rhinitis, and atrial septal defect with stiff heart syndrome.   Discussed the use of AI scribe software for clinical note transcription with the patient, who gave verbal consent to proceed.  History of Present Illness      Drug Allergies:  Allergies  Allergen Reactions   Cat Dander     Other reaction(s): wheezing   Dust Mite Extract Cough   Tree Extract Cough   Duraflex     Physical Exam: There were no vitals taken for this visit.   Physical Exam  Diagnostics:    Assessment and Plan: No diagnosis found.  No orders of the defined types were placed in this encounter.   There are no Patient Instructions on file for this visit.  No follow-ups on file.    Thank you for the opportunity to care for this patient.  Please do not hesitate to contact me with questions.  Arlean Mutter, FNP [facility]

## 2024-03-16 NOTE — Patient Instructions (Incomplete)
 Asthma Continue Symbicort  160-2 puffs twice a day to prevent cough or wheeze Continue albuterol  2 puffs once every 4 hours if needed for cough or wheeze You may use albuterol  2 puffs 5-15 minutes before activity to decrease cough or wheeze   Allergic rhinitis Continue montelukast  10 mg once a day to prevent allergy symptoms Continue cetirizine  10 mg once a day if needed for a runny nose or itch Continue Flonase  2 sprays in each nostril once a day if needed for a stuffy nose Consider saline nasal rinses as needed for nasal symptoms. Use this before any medicated nasal sprays for best result  Call the clinic if this treatment plan is not working well for you  Follow up in *** or sooner if needed.

## 2024-03-17 ENCOUNTER — Ambulatory Visit: Admitting: Family Medicine

## 2024-03-17 ENCOUNTER — Encounter: Payer: Self-pay | Admitting: Family Medicine

## 2024-03-17 ENCOUNTER — Other Ambulatory Visit: Payer: Self-pay

## 2024-03-17 VITALS — BP 106/70 | HR 56 | Temp 97.7°F | Resp 19

## 2024-03-17 DIAGNOSIS — J4541 Moderate persistent asthma with (acute) exacerbation: Secondary | ICD-10-CM

## 2024-03-17 DIAGNOSIS — B9689 Other specified bacterial agents as the cause of diseases classified elsewhere: Secondary | ICD-10-CM | POA: Diagnosis not present

## 2024-03-17 DIAGNOSIS — J3089 Other allergic rhinitis: Secondary | ICD-10-CM | POA: Diagnosis not present

## 2024-03-17 DIAGNOSIS — J302 Other seasonal allergic rhinitis: Secondary | ICD-10-CM

## 2024-03-17 DIAGNOSIS — J019 Acute sinusitis, unspecified: Secondary | ICD-10-CM

## 2024-03-17 MED ORDER — PREDNISONE 10 MG PO TABS: ORAL_TABLET | ORAL | 0 refills | Status: AC

## 2024-03-17 MED ORDER — AMOXICILLIN-POT CLAVULANATE 875-125 MG PO TABS: 1.0000 | ORAL_TABLET | Freq: Two times a day (BID) | ORAL | 0 refills | Status: AC

## 2024-03-25 DIAGNOSIS — G2581 Restless legs syndrome: Secondary | ICD-10-CM | POA: Diagnosis not present

## 2024-03-25 DIAGNOSIS — E611 Iron deficiency: Secondary | ICD-10-CM | POA: Diagnosis not present
